# Patient Record
Sex: Male | Born: 1965 | Race: White | Hispanic: No | Marital: Married | Smoking: Never smoker
Health system: Southern US, Community
[De-identification: ages and names within clinical notes are randomized; demographics above are authoritative.]

## PROBLEM LIST (undated history)

## (undated) DIAGNOSIS — M5137 Other intervertebral disc degeneration, lumbosacral region: Secondary | ICD-10-CM

## (undated) DIAGNOSIS — I10 Essential (primary) hypertension: Secondary | ICD-10-CM

## (undated) DIAGNOSIS — T7840XA Allergy, unspecified, initial encounter: Secondary | ICD-10-CM

## (undated) DIAGNOSIS — K219 Gastro-esophageal reflux disease without esophagitis: Secondary | ICD-10-CM

## (undated) DIAGNOSIS — M51379 Other intervertebral disc degeneration, lumbosacral region without mention of lumbar back pain or lower extremity pain: Secondary | ICD-10-CM

## (undated) DIAGNOSIS — C801 Malignant (primary) neoplasm, unspecified: Secondary | ICD-10-CM

## (undated) DIAGNOSIS — M1990 Unspecified osteoarthritis, unspecified site: Secondary | ICD-10-CM

## (undated) HISTORY — DX: Essential (primary) hypertension: I10

## (undated) HISTORY — DX: Unspecified osteoarthritis, unspecified site: M19.90

## (undated) HISTORY — DX: Malignant (primary) neoplasm, unspecified: C80.1

## (undated) HISTORY — DX: Gastro-esophageal reflux disease without esophagitis: K21.9

## (undated) HISTORY — DX: Other intervertebral disc degeneration, lumbosacral region: M51.37

## (undated) HISTORY — PX: OTHER SURGICAL HISTORY: SHX169

## (undated) HISTORY — PX: EXCISION MORTON'S NEUROMA: SHX5013

## (undated) HISTORY — DX: Allergy, unspecified, initial encounter: T78.40XA

## (undated) HISTORY — DX: Other intervertebral disc degeneration, lumbosacral region without mention of lumbar back pain or lower extremity pain: M51.379

## (undated) HISTORY — PX: UPPER GASTROINTESTINAL ENDOSCOPY: SHX188

---

## 1999-08-12 DIAGNOSIS — C801 Malignant (primary) neoplasm, unspecified: Secondary | ICD-10-CM

## 1999-08-12 HISTORY — DX: Malignant (primary) neoplasm, unspecified: C80.1

## 2000-04-10 ENCOUNTER — Other Ambulatory Visit: Admission: RE | Admit: 2000-04-10 | Discharge: 2000-04-10 | Payer: Self-pay | Admitting: Orthopedic Surgery

## 2000-05-21 ENCOUNTER — Encounter: Admission: RE | Admit: 2000-05-21 | Discharge: 2000-08-19 | Payer: Self-pay | Admitting: Radiation Oncology

## 2001-12-10 ENCOUNTER — Ambulatory Visit (HOSPITAL_COMMUNITY): Admission: RE | Admit: 2001-12-10 | Discharge: 2001-12-10 | Payer: Self-pay | Admitting: Gastroenterology

## 2003-05-12 ENCOUNTER — Encounter: Payer: Self-pay | Admitting: Family Medicine

## 2003-05-12 ENCOUNTER — Ambulatory Visit (HOSPITAL_COMMUNITY): Admission: RE | Admit: 2003-05-12 | Discharge: 2003-05-12 | Payer: Self-pay | Admitting: Family Medicine

## 2006-05-26 ENCOUNTER — Ambulatory Visit: Payer: Self-pay | Admitting: Sports Medicine

## 2006-06-09 ENCOUNTER — Ambulatory Visit: Payer: Self-pay | Admitting: Family Medicine

## 2008-09-28 ENCOUNTER — Ambulatory Visit: Payer: Self-pay | Admitting: Sports Medicine

## 2008-09-28 DIAGNOSIS — M7750 Other enthesopathy of unspecified foot: Secondary | ICD-10-CM | POA: Insufficient documentation

## 2008-09-28 DIAGNOSIS — M25559 Pain in unspecified hip: Secondary | ICD-10-CM | POA: Insufficient documentation

## 2008-09-28 DIAGNOSIS — M216X9 Other acquired deformities of unspecified foot: Secondary | ICD-10-CM

## 2008-10-09 ENCOUNTER — Encounter (INDEPENDENT_AMBULATORY_CARE_PROVIDER_SITE_OTHER): Payer: Self-pay | Admitting: *Deleted

## 2008-12-14 ENCOUNTER — Ambulatory Visit: Payer: Self-pay | Admitting: Sports Medicine

## 2009-05-21 ENCOUNTER — Ambulatory Visit: Payer: Self-pay | Admitting: Family Medicine

## 2009-05-21 LAB — CONVERTED CEMR LAB
ALT: 21 units/L (ref 0–53)
AST: 22 units/L (ref 0–37)
Albumin: 4.2 g/dL (ref 3.5–5.2)
Alkaline Phosphatase: 50 units/L (ref 39–117)
BUN: 18 mg/dL (ref 6–23)
Basophils Absolute: 0 10*3/uL (ref 0.0–0.1)
Basophils Relative: 0.4 % (ref 0.0–3.0)
Bilirubin Urine: NEGATIVE
Bilirubin, Direct: 0.1 mg/dL (ref 0.0–0.3)
Blood in Urine, dipstick: NEGATIVE
CO2: 31 meq/L (ref 19–32)
Calcium: 9.1 mg/dL (ref 8.4–10.5)
Chloride: 102 meq/L (ref 96–112)
Cholesterol: 173 mg/dL (ref 0–200)
Creatinine, Ser: 1.1 mg/dL (ref 0.4–1.5)
Eosinophils Absolute: 0.1 10*3/uL (ref 0.0–0.7)
Eosinophils Relative: 2.2 % (ref 0.0–5.0)
GFR calc non Af Amer: 77.89 mL/min (ref >60)
Glucose, Bld: 97 mg/dL (ref 70–99)
Glucose, Urine, Semiquant: NEGATIVE
HCT: 44.3 % (ref 39.0–52.0)
HDL: 47.3 mg/dL (ref >39.00)
Hemoglobin: 15.2 g/dL (ref 13.0–17.0)
Ketones, urine, test strip: NEGATIVE
LDL Cholesterol: 113 mg/dL — ABNORMAL HIGH (ref 0–99)
Lymphocytes Relative: 35.6 % (ref 12.0–46.0)
Lymphs Abs: 1.6 10*3/uL (ref 0.7–4.0)
MCHC: 34.2 g/dL (ref 30.0–36.0)
MCV: 89.6 fL (ref 78.0–100.0)
Monocytes Absolute: 0.3 10*3/uL (ref 0.1–1.0)
Monocytes Relative: 6.7 % (ref 3.0–12.0)
Neutro Abs: 2.4 10*3/uL (ref 1.4–7.7)
Neutrophils Relative %: 55.1 % (ref 43.0–77.0)
Nitrite: NEGATIVE
Platelets: 183 10*3/uL (ref 150.0–400.0)
Potassium: 4.5 meq/L (ref 3.5–5.1)
Protein, U semiquant: NEGATIVE
RBC: 4.95 M/uL (ref 4.22–5.81)
RDW: 12.3 % (ref 11.5–14.6)
Sodium: 139 meq/L (ref 135–145)
Specific Gravity, Urine: 1.015
TSH: 0.82 microintl units/mL (ref 0.35–5.50)
Total Bilirubin: 1 mg/dL (ref 0.3–1.2)
Total CHOL/HDL Ratio: 4
Total Protein: 6.6 g/dL (ref 6.0–8.3)
Triglycerides: 66 mg/dL (ref 0.0–149.0)
Urobilinogen, UA: 0.2
VLDL: 13.2 mg/dL (ref 0.0–40.0)
WBC Urine, dipstick: NEGATIVE
WBC: 4.4 10*3/uL — ABNORMAL LOW (ref 4.5–10.5)
pH: 7.5

## 2009-05-28 ENCOUNTER — Ambulatory Visit: Payer: Self-pay | Admitting: Family Medicine

## 2010-12-27 NOTE — Procedures (Signed)
[facility]  Patient:    Neil Cox, Neil Cox Visit Number: [id number] MRN: [id number]          Service Type: END Location: ENDO Attending Physician:  Louie Bun Dictated by:   Everardo All Madilyn Fireman, M.D. Proc. Date: 12/10/01 Admit Date:  12/10/2001                             Procedure Report  PROCEDURE:  Esophagogastroduodenoscopy with esophageal dilatation.  INDICATION FOR PROCEDURE:  Solid food dysphagia suggestive of lower esophageal ring or stricture.  DESCRIPTION OF PROCEDURE:  The patient was placed in the left lateral decubitus position and placed on the pulse monitor with continuous low-flow oxygen delivered by nasal cannula.  He was sedated with 50 mg IV Demerol and 6 mg IV Versed.  The Olympus video endoscope was advanced under direct vision into the oropharynx and esophagus.  The esophagus was straight and of normal caliber with the squamocolumnar line at 38 cm.  Despite prolonged inspection, I did not see any evidence of a ring, stricture, or other narrowing of the GE junction and no visible esophagitis.  The stomach was entered, and a small amount of liquid secretions were suctioned from the fundus.  Retroflexed view of the cardia was unremarkable.  The fundus, body, antrum, and pylorus all appeared normal.  The duodenum was entered, and both the bulb and second portion were well-inspected and appeared to be within normal limits.  Savary guidewire was passed through the endoscope channel and the scope withdrawn.  A single 17 mm Savary dilator later was passed over the guidewire with minimal resistance and no blood seen on withdrawal.  The dilator was removed together together with the wire, and the patient returned to the recovery room in stable condition.  He tolerated the procedure well, and there were no immediate complications.  IMPRESSION:  No visible esophageal ring or stricture and basically normal endoscopy, status post empiric  dilatation to 17 mm.  PLAN:  Advance diet and observe response to dilatation. Dictated by:   Everardo All Madilyn Fireman, M.D. Attending Physician:  Louie Bun DD:  12/10/01 TD:  12/11/01 Job: [id number]

## 2013-04-20 ENCOUNTER — Encounter: Payer: Self-pay | Admitting: Family Medicine

## 2013-04-20 ENCOUNTER — Ambulatory Visit (INDEPENDENT_AMBULATORY_CARE_PROVIDER_SITE_OTHER): Payer: 59 | Admitting: Family Medicine

## 2013-04-20 VITALS — BP 124/74 | HR 60 | Temp 97.9°F | Ht 68.0 in | Wt 173.0 lb

## 2013-04-20 DIAGNOSIS — C44622 Squamous cell carcinoma of skin of right upper limb, including shoulder: Secondary | ICD-10-CM

## 2013-04-20 DIAGNOSIS — C44621 Squamous cell carcinoma of skin of unspecified upper limb, including shoulder: Secondary | ICD-10-CM | POA: Insufficient documentation

## 2013-04-20 DIAGNOSIS — Z0000 Encounter for general adult medical examination without abnormal findings: Secondary | ICD-10-CM

## 2013-04-20 LAB — TSH: TSH: 0.5 u[IU]/mL (ref 0.35–5.50)

## 2013-04-20 LAB — CBC WITH DIFFERENTIAL/PLATELET
Basophils Absolute: 0 10*3/uL (ref 0.0–0.1)
Basophils Relative: 0.4 % (ref 0.0–3.0)
Eosinophils Absolute: 0.1 10*3/uL (ref 0.0–0.7)
Eosinophils Relative: 2.2 % (ref 0.0–5.0)
HCT: 45.5 % (ref 39.0–52.0)
Hemoglobin: 15.6 g/dL (ref 13.0–17.0)
Lymphocytes Relative: 39.2 % (ref 12.0–46.0)
Lymphs Abs: 2 10*3/uL (ref 0.7–4.0)
MCHC: 34.3 g/dL (ref 30.0–36.0)
MCV: 87.7 fl (ref 78.0–100.0)
Monocytes Absolute: 0.4 10*3/uL (ref 0.1–1.0)
Monocytes Relative: 7.5 % (ref 3.0–12.0)
Neutro Abs: 2.5 10*3/uL (ref 1.4–7.7)
Neutrophils Relative %: 50.7 % (ref 43.0–77.0)
Platelets: 198 10*3/uL (ref 150.0–400.0)
RBC: 5.19 Mil/uL (ref 4.22–5.81)
RDW: 13.2 % (ref 11.5–14.6)
WBC: 5 10*3/uL (ref 4.5–10.5)

## 2013-04-20 LAB — LIPID PANEL
Cholesterol: 174 mg/dL (ref 0–200)
HDL: 61.8 mg/dL (ref >39.00)
LDL Cholesterol: 103 mg/dL — ABNORMAL HIGH (ref 0–99)
Total CHOL/HDL Ratio: 3
Triglycerides: 44 mg/dL (ref 0.0–149.0)
VLDL: 8.8 mg/dL (ref 0.0–40.0)

## 2013-04-20 LAB — HEPATIC FUNCTION PANEL
ALT: 20 U/L (ref 0–53)
AST: 23 U/L (ref 0–37)
Albumin: 4.3 g/dL (ref 3.5–5.2)
Alkaline Phosphatase: 42 U/L (ref 39–117)
Bilirubin, Direct: 0.2 mg/dL (ref 0.0–0.3)
Total Bilirubin: 1.1 mg/dL (ref 0.3–1.2)
Total Protein: 7.1 g/dL (ref 6.0–8.3)

## 2013-04-20 LAB — BASIC METABOLIC PANEL
BUN: 15 mg/dL (ref 6–23)
CO2: 29 mEq/L (ref 19–32)
Calcium: 9.1 mg/dL (ref 8.4–10.5)
Chloride: 100 mEq/L (ref 96–112)
Creatinine, Ser: 1 mg/dL (ref 0.4–1.5)
GFR: 90.61 mL/min (ref >60.00)
Glucose, Bld: 98 mg/dL (ref 70–99)
Potassium: 4.7 mEq/L (ref 3.5–5.1)
Sodium: 135 mEq/L (ref 135–145)

## 2013-04-20 NOTE — Progress Notes (Signed)
  Subjective:    Patient ID: Neil Cox, male    DOB: 08-Feb-1966, 47 y.o.   MRN: [id number]  HPI  Patient seen to reestablish care and for complete physical Generally very healthy. He had squamous cell carcinoma right thumb back in 2001. He is followed annually by dermatology for skin screening. He relates past history of mildly elevated blood pressure but never treated. He has had some osteoarthritis involving the lumbar spine. He exercises regularly.  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 He does regular core strengthening exercises. No history of smoking. Only rare alcohol use. Patient is married has 2 daughters.  Past Medical History  Diagnosis Date  . Arthritis   . Cancer   . Allergy   . Hypertension    History reviewed. No pertinent past surgical history.  reports that he has never smoked. He does not have any smokeless tobacco history on file. He reports that  drinks alcohol. He reports that he does not use illicit drugs. family history includes Arthritis in his father and paternal grandmother; Hypertension in his paternal grandmother. No Known Allergies   Review of Systems  Constitutional: Negative for fever, activity change, appetite change and fatigue.  HENT: Negative for ear pain, congestion and trouble swallowing.   Eyes: Negative for pain and visual disturbance.  Respiratory: Negative for cough, shortness of breath and wheezing.   Cardiovascular: Negative for chest pain and palpitations.  Gastrointestinal: Negative for nausea, vomiting, abdominal pain, diarrhea, constipation, blood in stool, abdominal distention and rectal pain.  Genitourinary: Negative for dysuria, hematuria and testicular pain.  Musculoskeletal: Positive for back pain. Negative for joint swelling and arthralgias.  Skin: Negative for rash.  Neurological: Negative for dizziness, syncope and headaches.  Hematological: Negative for adenopathy.  Psychiatric/Behavioral: Negative for confusion  and dysphoric mood.       Objective:   Physical Exam  Constitutional: He is oriented to person, place, and time. He appears well-developed and well-nourished. No distress.  HENT:  Head: Normocephalic and atraumatic.  Right Ear: External ear normal.  Left Ear: External ear normal.  Mouth/Throat: Oropharynx is clear and moist.  Eyes: Conjunctivae and EOM are normal. Pupils are equal, round, and reactive to light.  Neck: Normal range of motion. Neck supple. No thyromegaly present.  Cardiovascular: Normal rate, regular rhythm and normal heart sounds.   No murmur heard. Pulmonary/Chest: No respiratory distress. He has no wheezes. He has no rales.  Abdominal: Soft. Bowel sounds are normal. He exhibits no distension and no mass. There is no tenderness. There is no rebound and no guarding.  Genitourinary: Rectum normal and prostate normal.  Musculoskeletal: He exhibits no edema.  Lymphadenopathy:    He has no cervical adenopathy.  Neurological: He is alert and oriented to person, place, and time. He displays normal reflexes. No cranial nerve deficit.  Skin: No rash noted.  Psychiatric: He has a normal mood and affect.          Assessment & Plan:  Complete physical. Obtain screening lab work. Patient plans to get flu vaccine later through work. Continue yearly skin screening with prior history of squamous cell skin cancer

## 2013-05-23 ENCOUNTER — Ambulatory Visit: Payer: 59

## 2013-06-07 ENCOUNTER — Ambulatory Visit: Payer: Self-pay

## 2013-11-15 ENCOUNTER — Encounter: Payer: Self-pay | Admitting: Family Medicine

## 2013-11-15 ENCOUNTER — Ambulatory Visit (INDEPENDENT_AMBULATORY_CARE_PROVIDER_SITE_OTHER): Payer: 59 | Admitting: Family Medicine

## 2013-11-15 VITALS — BP 134/80 | HR 71 | Temp 98.6°F | Ht 68.25 in | Wt 172.8 lb

## 2013-11-15 DIAGNOSIS — M25559 Pain in unspecified hip: Secondary | ICD-10-CM

## 2013-11-15 DIAGNOSIS — M25552 Pain in left hip: Secondary | ICD-10-CM

## 2013-11-15 MED ORDER — CYCLOBENZAPRINE HCL 10 MG PO TABS: 10.0000 mg | ORAL_TABLET | Freq: Three times a day (TID) | ORAL | Status: DC | PRN

## 2013-11-15 MED ORDER — NAPROXEN 500 MG PO TABS: 500.0000 mg | ORAL_TABLET | Freq: Two times a day (BID) | ORAL | Status: DC

## 2013-11-15 NOTE — Progress Notes (Signed)
   Subjective:    Patient ID: Neil Cox, male    DOB: [birth year], 48 y.o.   MRN: [id number]  HPI  This 48 y.o. male presents for evaluation of left hip discomfort.  Review of Systems No chest pain, SOB, HA, dizziness, vision change, N/V, diarrhea, constipation, dysuria, urinary urgency or frequency, myalgias, arthralgias or rash.     Objective:   Physical Exam Vital signs noted  Well developed well nourished male.  HEENT - Head atraumatic Normocephalic Respiratory - Lungs CTA bilateral Cardiac - RRR S1 and S2 without murmur GI - Abdomen soft Nontender and bowel sounds active x 4 MS - Left Piriforms muscles TTP       Assessment & Plan:  Left hip pain - Plan: naproxen (NAPROSYN) 500 MG tablet, cyclobenzaprine (FLEXERIL) 10 MG tablet  Lysbeth Penner FNP

## 2014-01-31 ENCOUNTER — Encounter: Payer: Self-pay | Admitting: Family Medicine

## 2014-01-31 ENCOUNTER — Ambulatory Visit (INDEPENDENT_AMBULATORY_CARE_PROVIDER_SITE_OTHER): Payer: 59 | Admitting: Family Medicine

## 2014-01-31 VITALS — BP 143/78 | HR 81 | Ht 68.0 in | Wt 170.0 lb

## 2014-01-31 DIAGNOSIS — M722 Plantar fascial fibromatosis: Secondary | ICD-10-CM

## 2014-01-31 DIAGNOSIS — R269 Unspecified abnormalities of gait and mobility: Secondary | ICD-10-CM

## 2014-02-01 ENCOUNTER — Encounter: Payer: Self-pay | Admitting: Family Medicine

## 2014-02-01 DIAGNOSIS — R269 Unspecified abnormalities of gait and mobility: Secondary | ICD-10-CM | POA: Insufficient documentation

## 2014-02-01 DIAGNOSIS — M722 Plantar fascial fibromatosis: Secondary | ICD-10-CM | POA: Insufficient documentation

## 2014-02-01 NOTE — Progress Notes (Signed)
Patient ID: Neil Cox, male   DOB: [birth year], 48 y.o.   MRN: [id number]  PCP: Anthoney Harada, MD  Subjective:   HPI: Patient is a 48 y.o. male here for orthotics.  Patient has prior history of bilateral plantar fasciitis that has been flaring up past year (dates back to over 10 years ago). Has tried PT, home exercises, stretches, medications, remotely had injeciton - orthotics were only thing to help and current ones are several years old, broken down. Gets pain at base of left 5th metatarsal and at 1st MTPs at times. Current orthotics are cracked and MT pads worn down.  Past Medical History  Diagnosis Date  . Arthritis   . Cancer   . Allergy   . Hypertension     Current Outpatient Prescriptions on File Prior to Visit  Medication Sig Dispense Refill  . cyclobenzaprine (FLEXERIL) 10 MG tablet Take 1 tablet (10 mg total) by mouth 3 (three) times daily as needed for muscle spasms.  30 tablet  1  . naproxen (NAPROSYN) 500 MG tablet Take 1 tablet (500 mg total) by mouth 2 (two) times daily with a meal.  30 tablet  0   No current facility-administered medications on file prior to visit.    History reviewed. No pertinent past surgical history.  No Known Allergies  History   Social History  . Marital Status: Married    Spouse Name: N/A    Number of Children: N/A  . Years of Education: N/A   Occupational History  . Not on file.   Social History Main Topics  . Smoking status: Never Smoker   . Smokeless tobacco: Not on file  . Alcohol Use: Yes  . Drug Use: No  . Sexual Activity: Not on file   Other Topics Concern  . Not on file   Social History Narrative  . No narrative on file    Family History  Problem Relation Age of Onset  . Arthritis Father   . Arthritis Paternal Grandmother   . Hypertension Paternal Grandmother     BP 143/78  Pulse 81  Ht 5\' 8"  (1.727 m)  Wt 170 lb (77.111 kg)  BMI 25.85 kg/m2  Review of Systems: See HPI above.     Objective:  Physical Exam:  Gen: NAD  Bilateral feet: Leg lengths equal. Bilateral transverse arch collapse, mild overpronation.  Minimal callus left side over 3rd MT head. No other gross deformity, swelling, ecchymoses FROM ankles, 1st MTPs. Minimal TTP bilateral planar fasciae. Negative ant drawer and talar tilt.   Thompsons test negative. NV intact distally.    Assessment & Plan:  1. Bilateral plantar fasciitis - with transverse arch breakdown as well.  New orthotics made today. Patient was fitted for a : standard, cushioned, semi-rigid orthotic. The orthotic was heated and afterward the patient stood on the orthotic blank positioned on the orthotic stand. The patient was positioned in subtalar neutral position and 10 degrees of ankle dorsiflexion in a weight bearing stance. After completion of molding, a stable base was applied to the orthotic blank. The blank was ground to a stable position for weight bearing. Size: 10 Base: blue med density eva Posting: none Additional orthotic padding: MT pads Total prep time 45 minutes

## 2014-02-01 NOTE — Assessment & Plan Note (Signed)
with transverse arch breakdown as well.  New orthotics made today. Patient was fitted for a : standard, cushioned, semi-rigid orthotic. The orthotic was heated and afterward the patient stood on the orthotic blank positioned on the orthotic stand. The patient was positioned in subtalar neutral position and 10 degrees of ankle dorsiflexion in a weight bearing stance. After completion of molding, a stable base was applied to the orthotic blank. The blank was ground to a stable position for weight bearing. Size: 10 Base: blue med density eva Posting: none Additional orthotic padding: MT pads

## 2014-02-15 ENCOUNTER — Encounter: Payer: 59 | Admitting: Sports Medicine

## 2014-02-22 ENCOUNTER — Encounter: Payer: 59 | Admitting: Sports Medicine

## 2014-04-12 ENCOUNTER — Ambulatory Visit (INDEPENDENT_AMBULATORY_CARE_PROVIDER_SITE_OTHER): Payer: 59 | Admitting: Sports Medicine

## 2014-04-12 ENCOUNTER — Ambulatory Visit: Payer: 59 | Admitting: Sports Medicine

## 2014-04-12 ENCOUNTER — Encounter: Payer: Self-pay | Admitting: Sports Medicine

## 2014-04-12 VITALS — BP 119/78 | HR 67 | Ht 68.0 in | Wt 168.0 lb

## 2014-04-12 DIAGNOSIS — M25559 Pain in unspecified hip: Secondary | ICD-10-CM

## 2014-04-12 DIAGNOSIS — M722 Plantar fascial fibromatosis: Secondary | ICD-10-CM

## 2014-04-12 DIAGNOSIS — M25551 Pain in right hip: Secondary | ICD-10-CM

## 2014-04-12 DIAGNOSIS — R269 Unspecified abnormalities of gait and mobility: Secondary | ICD-10-CM

## 2014-04-12 NOTE — Progress Notes (Signed)
Patient ID: Neil Cox, male   DOB: 1966-02-04, 48 y.o.   MRN: [id number]   Patient had orthotics made in July by Dr Barbaraann Barthel Long standing foot pain from PF and MT Supinated gait and cavus foot  His orthotics had been used 5 years and had broken down about 3 mos before replaced Almost all of his foot pain came back over PF and MT areas Now 80% of this has resolved with new orthotics  However, left lateral foot is painful and fells pressure with running, walking  Second problem is RT lateral hip pain Had seen Dr Veverly Fells for meniscus in RT knee Developed greater trochanteric bursal area pain CSI helped briefly Piriformis stretch recreates pain laterally  Exam NAD BP 119/78  Pulse 67  Ht 5\' 8"  (1.727 m)  Wt 168 lb (76.204 kg)  BMI 25.55 kg/m2  Foot is cavus shape but with standing has some loss of long arch No TTP over MT or PF today Pain along lateral column - 4th/5th rays on left with more rotation of toes 4 and 5 Walking gait is supinated - more on left  Hip: ROM normal bilat FABER is tight / RT > LT Strength Abduction weak on RT only IR: 5/5, ER: 5/5, Flexion: 5/5, Extension: 5/5, , Adduction: 5/5 Pelvic alignment unremarkable to inspection and palpation. Standing hip rotation and gait without trendelenburg / unsteadiness. Greater trochanter sometenderness to palpation.  No SI joint tenderness and normal minimal SI movement.

## 2014-04-12 NOTE — Assessment & Plan Note (Signed)
sxs improved post orthotics

## 2014-04-12 NOTE — Assessment & Plan Note (Signed)
Corrects well with orthotics We added lateral column post on left orthotic  This was comfortable after correction  See if this resolves pain

## 2014-04-12 NOTE — Assessment & Plan Note (Signed)
Hx of this in past  Now with weakness  Will start HEP daily to focus on abduction strength  Reck and scan if not resolved in 6 wks

## 2014-05-03 ENCOUNTER — Other Ambulatory Visit (INDEPENDENT_AMBULATORY_CARE_PROVIDER_SITE_OTHER): Payer: 59

## 2014-05-03 DIAGNOSIS — Z0000 Encounter for general adult medical examination without abnormal findings: Secondary | ICD-10-CM

## 2014-05-03 LAB — CBC WITH DIFFERENTIAL/PLATELET
BASOS ABS: 0 10*3/uL (ref 0.0–0.1)
Basophils Relative: 0.4 % (ref 0.0–3.0)
EOS PCT: 0.9 % (ref 0.0–5.0)
Eosinophils Absolute: 0 10*3/uL (ref 0.0–0.7)
HCT: 42.9 % (ref 39.0–52.0)
Hemoglobin: 14.3 g/dL (ref 13.0–17.0)
Lymphocytes Relative: 36.6 % (ref 12.0–46.0)
Lymphs Abs: 1.7 10*3/uL (ref 0.7–4.0)
MCHC: 33.4 g/dL (ref 30.0–36.0)
MCV: 89.3 fl (ref 78.0–100.0)
MONO ABS: 0.3 10*3/uL (ref 0.1–1.0)
Monocytes Relative: 7.4 % (ref 3.0–12.0)
NEUTROS PCT: 54.7 % (ref 43.0–77.0)
Neutro Abs: 2.6 10*3/uL (ref 1.4–7.7)
PLATELETS: 173 10*3/uL (ref 150.0–400.0)
RBC: 4.81 Mil/uL (ref 4.22–5.81)
RDW: 13.2 % (ref 11.5–15.5)
WBC: 4.7 10*3/uL (ref 4.0–10.5)

## 2014-05-03 LAB — BASIC METABOLIC PANEL
BUN: 14 mg/dL (ref 6–23)
CO2: 28 mEq/L (ref 19–32)
Calcium: 8.9 mg/dL (ref 8.4–10.5)
Chloride: 101 mEq/L (ref 96–112)
Creatinine, Ser: 1 mg/dL (ref 0.4–1.5)
GFR: 85.02 mL/min (ref >60.00)
Glucose, Bld: 96 mg/dL (ref 70–99)
Potassium: 3.6 mEq/L (ref 3.5–5.1)
Sodium: 135 mEq/L (ref 135–145)

## 2014-05-03 LAB — POCT URINALYSIS DIPSTICK
BILIRUBIN UA: NEGATIVE
Blood, UA: NEGATIVE
Glucose, UA: NEGATIVE
KETONES UA: NEGATIVE
LEUKOCYTES UA: NEGATIVE
Nitrite, UA: NEGATIVE
Protein, UA: NEGATIVE
Spec Grav, UA: 1.01
Urobilinogen, UA: 0.2
pH, UA: 7

## 2014-05-03 LAB — HEPATIC FUNCTION PANEL
ALK PHOS: 38 U/L — AB (ref 39–117)
ALT: 18 U/L (ref 0–53)
AST: 18 U/L (ref 0–37)
Albumin: 4.3 g/dL (ref 3.5–5.2)
BILIRUBIN DIRECT: 0.1 mg/dL (ref 0.0–0.3)
BILIRUBIN TOTAL: 1.1 mg/dL (ref 0.2–1.2)
Total Protein: 7 g/dL (ref 6.0–8.3)

## 2014-05-03 LAB — LIPID PANEL
CHOLESTEROL: 158 mg/dL (ref 0–200)
HDL: 50.7 mg/dL (ref >39.00)
LDL CALC: 97 mg/dL (ref 0–99)
NonHDL: 107.3
TRIGLYCERIDES: 54 mg/dL (ref 0.0–149.0)
Total CHOL/HDL Ratio: 3
VLDL: 10.8 mg/dL (ref 0.0–40.0)

## 2014-05-03 LAB — TSH: TSH: 0.37 u[IU]/mL (ref 0.35–4.50)

## 2014-05-08 ENCOUNTER — Ambulatory Visit (INDEPENDENT_AMBULATORY_CARE_PROVIDER_SITE_OTHER): Payer: 59 | Admitting: Family Medicine

## 2014-05-08 ENCOUNTER — Encounter: Payer: Self-pay | Admitting: Family Medicine

## 2014-05-08 VITALS — BP 128/74 | HR 72 | Temp 98.3°F | Ht 68.0 in | Wt 170.0 lb

## 2014-05-08 DIAGNOSIS — Z23 Encounter for immunization: Secondary | ICD-10-CM

## 2014-05-08 DIAGNOSIS — Z0000 Encounter for general adult medical examination without abnormal findings: Secondary | ICD-10-CM

## 2014-05-08 NOTE — Progress Notes (Signed)
Pre visit review using our clinic review tool, if applicable. No additional management support is needed unless otherwise documented below in the visit note. 

## 2014-05-08 NOTE — Addendum Note (Signed)
Addended by: Marcina Millard on: 05/08/2014 10:48 AM   Modules accepted: Orders

## 2014-05-08 NOTE — Progress Notes (Signed)
   Subjective:    Patient ID: Neil Cox, male    DOB: Sep 23, 1965, 48 y.o.   MRN: [id number]  HPI Patient here for well visit. He has a very health conscious. He and his wife follow low saturated fat diet. Exercises regularly. Never smoked. Last tetanus 10 years ago. Plans to get flu vaccine through work. He has remote history of squamous cell carcinoma of the thumb and is followed regularly by dermatology.  Past Medical History  Diagnosis Date  . Arthritis   . Cancer   . Allergy   . Hypertension    No past surgical history on file.  reports that he has never smoked. He does not have any smokeless tobacco history on file. He reports that he drinks alcohol. He reports that he does not use illicit drugs. family history includes Arthritis in his father and paternal grandmother; Diabetes in his brother and father; Hypertension in his paternal grandmother. No Known Allergies    Review of Systems  Constitutional: Negative for fever, activity change, appetite change and fatigue.  HENT: Negative for congestion, ear pain and trouble swallowing.   Eyes: Negative for pain and visual disturbance.  Respiratory: Negative for cough, shortness of breath and wheezing.   Cardiovascular: Negative for chest pain and palpitations.  Gastrointestinal: Negative for nausea, vomiting, abdominal pain, diarrhea, constipation, blood in stool, abdominal distention and rectal pain.  Endocrine: Negative for polydipsia and polyuria.  Genitourinary: Negative for dysuria, hematuria and testicular pain.  Musculoskeletal: Negative for arthralgias and joint swelling.  Skin: Negative for rash.  Neurological: Negative for dizziness, syncope and headaches.  Hematological: Negative for adenopathy.  Psychiatric/Behavioral: Negative for confusion and dysphoric mood.       Objective:   Physical Exam  Constitutional: He is oriented to person, place, and time. He appears well-developed and well-nourished. No distress.    HENT:  Head: Normocephalic and atraumatic.  Right Ear: External ear normal.  Left Ear: External ear normal.  Mouth/Throat: Oropharynx is clear and moist.  Eyes: Conjunctivae and EOM are normal. Pupils are equal, round, and reactive to light.  Neck: Normal range of motion. Neck supple. No thyromegaly present.  Cardiovascular: Normal rate, regular rhythm and normal heart sounds.   No murmur heard. Pulmonary/Chest: No respiratory distress. He has no wheezes. He has no rales.  Abdominal: Soft. Bowel sounds are normal. He exhibits no distension and no mass. There is no tenderness. There is no rebound and no guarding.  Musculoskeletal: He exhibits no edema.  Lymphadenopathy:    He has no cervical adenopathy.  Neurological: He is alert and oriented to person, place, and time. He displays normal reflexes. No cranial nerve deficit.  Skin: No rash noted.  Psychiatric: He has a normal mood and affect.          Assessment & Plan:  Patient is seen for complete physical. Tetanus booster given. Labs reviewed with no major concerns. He'll continue regular dermatology followup for skin cancer screening. Flu vaccine through work

## 2014-05-10 ENCOUNTER — Encounter: Payer: 59 | Admitting: Family Medicine

## 2015-03-06 ENCOUNTER — Emergency Department (HOSPITAL_COMMUNITY)
Admission: EM | Admit: 2015-03-06 | Discharge: 2015-03-06 | Disposition: A | Discharge status: Home / Self Care | Payer: 59 | Attending: Emergency Medicine | Admitting: Emergency Medicine

## 2015-03-06 ENCOUNTER — Encounter (HOSPITAL_COMMUNITY): Payer: Self-pay | Admitting: *Deleted

## 2015-03-06 DIAGNOSIS — Z8739 Personal history of other diseases of the musculoskeletal system and connective tissue: Secondary | ICD-10-CM | POA: Insufficient documentation

## 2015-03-06 DIAGNOSIS — Z23 Encounter for immunization: Secondary | ICD-10-CM | POA: Diagnosis present

## 2015-03-06 DIAGNOSIS — I10 Essential (primary) hypertension: Secondary | ICD-10-CM | POA: Diagnosis not present

## 2015-03-06 DIAGNOSIS — Z859 Personal history of malignant neoplasm, unspecified: Secondary | ICD-10-CM | POA: Diagnosis not present

## 2015-03-06 DIAGNOSIS — Z203 Contact with and (suspected) exposure to rabies: Secondary | ICD-10-CM | POA: Diagnosis not present

## 2015-03-06 DIAGNOSIS — Z79899 Other long term (current) drug therapy: Secondary | ICD-10-CM | POA: Insufficient documentation

## 2015-03-06 MED ORDER — RABIES IMMUNE GLOBULIN 150 UNIT/ML IM INJ
20.0000 [IU]/kg | INJECTION | Freq: Once | INTRAMUSCULAR | Status: AC
  Administered 2015-03-06: 1575 [IU] via INTRAMUSCULAR
  Filled 2015-03-06: qty 10.5

## 2015-03-06 MED ORDER — RABIES VACCINE, PCEC IM SUSR
1.0000 mL | Freq: Once | INTRAMUSCULAR | Status: AC
  Administered 2015-03-06: 1 mL via INTRAMUSCULAR
  Filled 2015-03-06: qty 1

## 2015-03-06 NOTE — Discharge Instructions (Signed)
°  Rabies Vaccine      [facility]     [address], [place] [address]              [phone]                                  [place] PATIENT  Patient's Name: Retia Passe Laba                     Original Order Date:  03/06/2015  Medical Record Number: [id number]  ED Physician: . Primary Diagnosis: Rabies Exposure       PCP: Eulas Post, MD . RABIES VACCINE:  Patient Phone Number: (home) [phone] (home)    (cell)  Telephone Information:  Mobile [phone]    (work) [phone] (work) Species of Animal: bat IMMUNOGLOBULIN INJECTION GIVEN IN THE ER?: yes   You have been seen in the Emergency Department for a possible rabies exposure.  You must return for the additional vaccine doses to the Urgent [place] ([place] next to [facility].  If needed, your immunoglobulin follow-up injections, need to be scheduled for the dates below. If your first visit should fall on a weekend day, please come anytime between the hours of 9am-7pm.  DAY 0:  Date 03/06/15     To: ER  DAY 3:  Date 03/09/15     To:  Urgent Care  DAY 7:  Date 03/13/15     To:  Urgent Care  DAY 14:  Date 03/20/15   To:  Urgent Care  The 5th vaccine injection is considered for immune compromised patients only.  The Urgent Care Center is open from 8am-8pm Monday thru Friday and 9am-7pm on Saturdays and Sundays.  There will be a minimal fee for the injection that will be billed to your insurance company along with the charge for the vaccine.                                                                                                Date: 03/06/2015  Patient Signature: _________________________________________________  [facility] Copy   Patient Copy      Pharmacy Copy

## 2015-03-06 NOTE — ED Provider Notes (Signed)
CSN: [id number]     Arrival date & time 03/06/15  1505 History  This chart was scribed for non-physician practitioner, Domenic Moras, PA-C working with Sharlett Iles, MD by Tula Nakayama, ED scribe. This patient was seen in room TR11C/TR11C and the patient's care was started at 3:57 PM  Chief Complaint  Patient presents with  . Rabies Injection   The history is provided by the patient. No language interpreter was used.    HPI Comments: Neil Cox is a 49 y.o. male who presents to the Emergency Department for a rabies vaccination. Pt reports that he found a bat in his house last night and, upon further evaluation, found multiple bat colonies in his attic. He and his family wish to get a series of rabies vaccinations as a precaution. Pt denies a history of HIV or any immunocompromising factors. He also denies abdominal pain, chest pain, rash, bite marks or pain as associated symptoms.  Past Medical History  Diagnosis Date  . Arthritis   . Cancer   . Allergy   . Hypertension    History reviewed. No pertinent past surgical history. Family History  Problem Relation Age of Onset  . Arthritis Father   . Diabetes Father   . Arthritis Paternal Grandmother   . Hypertension Paternal Grandmother   . Diabetes Brother    History  Substance Use Topics  . Smoking status: Never Smoker   . Smokeless tobacco: Not on file  . Alcohol Use: Yes     Comment: 6 pack per week    Review of Systems  Cardiovascular: Negative for chest pain.  Gastrointestinal: Negative for abdominal pain.  Skin: Negative for rash and wound.    Allergies  Review of patient's allergies indicates no known allergies.  Home Medications   Prior to Admission medications   Medication Sig Start Date End Date Taking? Authorizing Provider  cyclobenzaprine (FLEXERIL) 10 MG tablet Take 1 tablet (10 mg total) by mouth 3 (three) times daily as needed for muscle spasms. 11/15/13   Lysbeth Penner, FNP  naproxen  (NAPROSYN) 500 MG tablet Take 1 tablet (500 mg total) by mouth 2 (two) times daily with a meal. 11/15/13   Lysbeth Penner, FNP   BP 129/78 mmHg  Pulse 72  Temp(Src) 97.9 F (36.6 C) (Oral)  Resp 14  SpO2 96% Physical Exam  Constitutional: He appears well-developed and well-nourished. No distress.  HENT:  Head: Normocephalic and atraumatic.  Eyes: Conjunctivae and EOM are normal.  Neck: Neck supple. No tracheal deviation present.  Cardiovascular: Normal rate, regular rhythm and normal heart sounds.   Pulmonary/Chest: Effort normal and breath sounds normal. No respiratory distress.  Skin: Skin is warm and dry.  Psychiatric: He has a normal mood and affect. His behavior is normal.  Nursing note and vitals reviewed.   ED Course  Procedures   DIAGNOSTIC STUDIES: Oxygen Saturation is 96% on RA, normal by my interpretation.    COORDINATION OF CARE: 4:01 PM Will administer first Rabies vaccination. Discussed treatment plan and vaccination series with pt at bedside. He agreed to plan.  4:35 PM Pt received rabies prophylactic schedule and will f/u appropriately.   Labs Review Labs Reviewed - No data to display  Imaging Review No results found.   EKG Interpretation None      MDM   Final diagnoses:  Rabies exposure    BP 129/78 mmHg  Pulse 72  Temp(Src) 97.9 F (36.6 C) (Oral)  Resp 14  Ht 5'  8" (1.727 m)  Wt 174 lb 4.8 oz (79.062 kg)  BMI 26.51 kg/m2  SpO2 96%   I personally performed the services described in this documentation, which was scribed in my presence. The recorded information has been reviewed and is accurate.     Domenic Moras, PA-C 03/06/15 [place], MD 03/08/15 [phone]

## 2015-03-06 NOTE — ED Notes (Signed)
Pt sates that he found a bat in his daughters room yesterday. States that he has not had rabies vaccines before. Not aware of being bit.

## 2015-03-09 ENCOUNTER — Encounter (HOSPITAL_COMMUNITY): Payer: Self-pay

## 2015-03-09 ENCOUNTER — Emergency Department (HOSPITAL_COMMUNITY)
Admission: EM | Admit: 2015-03-09 | Discharge: 2015-03-09 | Disposition: A | Discharge status: Home / Self Care | Payer: 59 | Source: Home / Self Care

## 2015-03-09 MED ORDER — RABIES VACCINE, PCEC IM SUSR
1.0000 mL | Freq: Once | INTRAMUSCULAR | Status: AC
  Administered 2015-03-09: 1 mL via INTRAMUSCULAR

## 2015-03-09 MED ORDER — RABIES VACCINE, PCEC IM SUSR
INTRAMUSCULAR | Status: AC
  Filled 2015-03-09: qty 1

## 2015-03-09 NOTE — ED Notes (Addendum)
Day #3 of series, no c/o

## 2015-03-13 ENCOUNTER — Emergency Department (INDEPENDENT_AMBULATORY_CARE_PROVIDER_SITE_OTHER)
Admission: EM | Admit: 2015-03-13 | Discharge: 2015-03-13 | Disposition: A | Discharge status: Home / Self Care | Payer: 59 | Source: Home / Self Care

## 2015-03-13 ENCOUNTER — Encounter (HOSPITAL_COMMUNITY): Payer: Self-pay | Admitting: Emergency Medicine

## 2015-03-13 DIAGNOSIS — Z203 Contact with and (suspected) exposure to rabies: Secondary | ICD-10-CM

## 2015-03-13 MED ORDER — RABIES VACCINE, PCEC IM SUSR
INTRAMUSCULAR | Status: AC
  Filled 2015-03-13: qty 1

## 2015-03-13 MED ORDER — RABIES VACCINE, PCEC IM SUSR
1.0000 mL | Freq: Once | INTRAMUSCULAR | Status: AC
  Administered 2015-03-13: 1 mL via INTRAMUSCULAR

## 2015-03-13 NOTE — Discharge Instructions (Signed)
Return on 8/9 for final rabies vaccination  °

## 2015-03-13 NOTE — ED Notes (Signed)
Here for day 7 rabies vaccination (#3) Voices no new concerns Alert, no acute distress.  

## 2015-03-24 ENCOUNTER — Encounter (HOSPITAL_COMMUNITY): Payer: Self-pay | Admitting: Emergency Medicine

## 2015-03-24 ENCOUNTER — Emergency Department (HOSPITAL_COMMUNITY)
Admission: EM | Admit: 2015-03-24 | Discharge: 2015-03-24 | Disposition: A | Discharge status: Home / Self Care | Payer: 59 | Source: Home / Self Care

## 2015-03-24 DIAGNOSIS — Z203 Contact with and (suspected) exposure to rabies: Secondary | ICD-10-CM | POA: Diagnosis not present

## 2015-03-24 MED ORDER — RABIES VACCINE, PCEC IM SUSR
INTRAMUSCULAR | Status: AC
  Filled 2015-03-24: qty 1

## 2015-03-24 MED ORDER — RABIES VACCINE, PCEC IM SUSR
1.0000 mL | Freq: Once | INTRAMUSCULAR | Status: AC
  Administered 2015-03-24: 1 mL via INTRAMUSCULAR

## 2015-03-24 NOTE — Discharge Instructions (Signed)
Return as needed

## 2015-03-24 NOTE — ED Notes (Signed)
Rabies series.  This injection was intended for as day 14, #4 in series.

## 2015-09-12 ENCOUNTER — Other Ambulatory Visit (INDEPENDENT_AMBULATORY_CARE_PROVIDER_SITE_OTHER): Payer: 59

## 2015-09-12 DIAGNOSIS — Z0000 Encounter for general adult medical examination without abnormal findings: Secondary | ICD-10-CM | POA: Diagnosis not present

## 2015-09-12 LAB — LIPID PANEL
Cholesterol: 161 mg/dL (ref 0–200)
HDL: 56.1 mg/dL (ref >39.00)
LDL Cholesterol: 92 mg/dL (ref 0–99)
NonHDL: 105.1
Total CHOL/HDL Ratio: 3
Triglycerides: 67 mg/dL (ref 0.0–149.0)
VLDL: 13.4 mg/dL (ref 0.0–40.0)

## 2015-09-12 LAB — CBC WITH DIFFERENTIAL/PLATELET
BASOS ABS: 0 10*3/uL (ref 0.0–0.1)
Basophils Relative: 0.4 % (ref 0.0–3.0)
EOS ABS: 0.1 10*3/uL (ref 0.0–0.7)
Eosinophils Relative: 1.2 % (ref 0.0–5.0)
HEMATOCRIT: 45.9 % (ref 39.0–52.0)
HEMOGLOBIN: 15.4 g/dL (ref 13.0–17.0)
LYMPHS PCT: 31.8 % (ref 12.0–46.0)
Lymphs Abs: 1.8 10*3/uL (ref 0.7–4.0)
MCHC: 33.5 g/dL (ref 30.0–36.0)
MCV: 88.2 fl (ref 78.0–100.0)
Monocytes Absolute: 0.4 10*3/uL (ref 0.1–1.0)
Monocytes Relative: 7.4 % (ref 3.0–12.0)
Neutro Abs: 3.4 10*3/uL (ref 1.4–7.7)
Neutrophils Relative %: 59.2 % (ref 43.0–77.0)
PLATELETS: 198 10*3/uL (ref 150.0–400.0)
RBC: 5.21 Mil/uL (ref 4.22–5.81)
RDW: 13.6 % (ref 11.5–15.5)
WBC: 5.8 10*3/uL (ref 4.0–10.5)

## 2015-09-12 LAB — HEPATIC FUNCTION PANEL
ALK PHOS: 42 U/L (ref 39–117)
ALT: 14 U/L (ref 0–53)
AST: 17 U/L (ref 0–37)
Albumin: 4.4 g/dL (ref 3.5–5.2)
Bilirubin, Direct: 0.2 mg/dL (ref 0.0–0.3)
TOTAL PROTEIN: 7.1 g/dL (ref 6.0–8.3)
Total Bilirubin: 1 mg/dL (ref 0.2–1.2)

## 2015-09-12 LAB — BASIC METABOLIC PANEL
BUN: 17 mg/dL (ref 6–23)
CALCIUM: 9.5 mg/dL (ref 8.4–10.5)
CO2: 28 mEq/L (ref 19–32)
CREATININE: 0.94 mg/dL (ref 0.40–1.50)
Chloride: 102 mEq/L (ref 96–112)
GFR: 90.79 mL/min (ref >60.00)
Glucose, Bld: 106 mg/dL — ABNORMAL HIGH (ref 70–99)
Potassium: 4.3 mEq/L (ref 3.5–5.1)
Sodium: 138 mEq/L (ref 135–145)

## 2015-09-12 LAB — TSH: TSH: 0.56 u[IU]/mL (ref 0.35–4.50)

## 2015-09-19 ENCOUNTER — Encounter: Payer: Self-pay | Admitting: Family Medicine

## 2015-09-19 ENCOUNTER — Ambulatory Visit (INDEPENDENT_AMBULATORY_CARE_PROVIDER_SITE_OTHER): Payer: 59 | Admitting: Family Medicine

## 2015-09-19 VITALS — BP 138/82 | HR 76 | Temp 98.4°F | Ht 68.0 in | Wt 177.9 lb

## 2015-09-19 DIAGNOSIS — Z0000 Encounter for general adult medical examination without abnormal findings: Secondary | ICD-10-CM | POA: Diagnosis not present

## 2015-09-19 NOTE — Progress Notes (Signed)
Pre visit review using our clinic review tool, if applicable. No additional management support is needed unless otherwise documented below in the visit note. 

## 2015-09-19 NOTE — Progress Notes (Signed)
   Subjective:    Patient ID: Neil Cox, male    DOB: 1966/05/29, 50 y.o.   MRN: [id number]  HPI Patient here for complete physical. 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 He continues to see dermatologist yearly. He's never had any recurrent skin cancer. Nonsmoker. Exercises regularly. Has had some poor compliance with diet over the past year has gained some weight. Very strong family history of type 2 diabetes as below. Takes no medications  Past Medical History  Diagnosis Date  . Arthritis   . Cancer ([place])   . Allergy   . Hypertension    No past surgical history on file.  reports that he has never smoked. He does not have any smokeless tobacco history on file. He reports that he drinks alcohol. He reports that he does not use illicit drugs. family history includes Arthritis in his father and paternal grandmother; Diabetes in his brother and father; Hypertension in his paternal grandmother. No Known Allergies    Review of Systems  Constitutional: Negative for fever, activity change, appetite change and fatigue.  HENT: Negative for congestion, ear pain and trouble swallowing.   Eyes: Negative for pain and visual disturbance.  Respiratory: Negative for cough, shortness of breath and wheezing.   Cardiovascular: Negative for chest pain and palpitations.  Gastrointestinal: Negative for nausea, vomiting, abdominal pain, diarrhea, constipation, blood in stool, abdominal distention and rectal pain.  Genitourinary: Negative for dysuria, hematuria and testicular pain.  Musculoskeletal: Negative for joint swelling and arthralgias.  Skin: Negative for rash.  Neurological: Negative for dizziness, syncope and headaches.  Hematological: Negative for adenopathy.  Psychiatric/Behavioral: Negative for confusion and dysphoric mood.       Objective:   Physical Exam  Constitutional: He is oriented to person, place, and time. He appears  well-developed and well-nourished. No distress.  HENT:  Head: Normocephalic and atraumatic.  Right Ear: External ear normal.  Left Ear: External ear normal.  Mouth/Throat: Oropharynx is clear and moist.  Eyes: Conjunctivae and EOM are normal. Pupils are equal, round, and reactive to light.  Neck: Normal range of motion. Neck supple. No thyromegaly present.  Cardiovascular: Normal rate, regular rhythm and normal heart sounds.   No murmur heard. Pulmonary/Chest: No respiratory distress. He has no wheezes. He has no rales.  Abdominal: Soft. Bowel sounds are normal. He exhibits no distension and no mass. There is no tenderness. There is no rebound and no guarding.  Musculoskeletal: He exhibits no edema.  Lymphadenopathy:    He has no cervical adenopathy.  Neurological: He is alert and oriented to person, place, and time. He displays normal reflexes. No cranial nerve deficit.  Skin: No rash noted.  Psychiatric: He has a normal mood and affect.          Assessment & Plan:  Physical exam. Labs reviewed. Glucose 106 and this has always been normal previously. We have advocated he try to lose some weight and continue regular exercise habits. Consider A1c at follow-up. Immunizations up-to-date. We discussed colon cancer screening and prostate cancer screening at age 20 and sooner if symptomatic

## 2015-10-24 DIAGNOSIS — Z85828 Personal history of other malignant neoplasm of skin: Secondary | ICD-10-CM | POA: Diagnosis not present

## 2015-10-24 DIAGNOSIS — L570 Actinic keratosis: Secondary | ICD-10-CM | POA: Diagnosis not present

## 2015-10-24 DIAGNOSIS — L814 Other melanin hyperpigmentation: Secondary | ICD-10-CM | POA: Diagnosis not present

## 2016-05-21 DIAGNOSIS — D485 Neoplasm of uncertain behavior of skin: Secondary | ICD-10-CM | POA: Diagnosis not present

## 2016-05-21 DIAGNOSIS — L570 Actinic keratosis: Secondary | ICD-10-CM | POA: Diagnosis not present

## 2016-08-08 ENCOUNTER — Encounter: Payer: Self-pay | Admitting: Family

## 2016-08-08 ENCOUNTER — Ambulatory Visit (INDEPENDENT_AMBULATORY_CARE_PROVIDER_SITE_OTHER): Payer: 59 | Admitting: Family

## 2016-08-08 VITALS — BP 123/80 | HR 64 | Temp 97.2°F | Ht 68.0 in | Wt 181.8 lb

## 2016-08-08 DIAGNOSIS — M25551 Pain in right hip: Secondary | ICD-10-CM | POA: Diagnosis not present

## 2016-08-08 DIAGNOSIS — M25552 Pain in left hip: Secondary | ICD-10-CM

## 2016-08-08 MED ORDER — PREDNISONE 10 MG (21) PO TBPK: ORAL_TABLET | ORAL | 0 refills | Status: DC

## 2016-08-08 MED ORDER — NAPROXEN 500 MG PO TABS: 500.0000 mg | ORAL_TABLET | Freq: Two times a day (BID) | ORAL | 1 refills | Status: DC

## 2016-08-08 NOTE — Progress Notes (Signed)
   Subjective:    Patient ID: Neil Cox, male    DOB: 03-19-1966, 50 y.o.   MRN: [id number]  Hip Pain   The incident occurred more than 1 week ago. There was no injury mechanism. The pain is present in the left hip. The quality of the pain is described as aching. The pain is moderate. The pain has been intermittent since onset. Pertinent negatives include no muscle weakness, numbness or tingling. He reports no foreign bodies present. The symptoms are aggravated by movement. He has tried acetaminophen and NSAIDs for the symptoms. The treatment provided mild relief.      Review of Systems  Neurological: Negative for tingling and numbness.  All other systems reviewed and are negative.      Objective:   Physical Exam  Constitutional: He is oriented to person, place, and time. He appears well-developed and well-nourished. No distress.  HENT:  Head: Normocephalic.  Cardiovascular: Normal rate, regular rhythm, normal heart sounds and intact distal pulses.   No murmur heard. Pulmonary/Chest: Effort normal and breath sounds normal. No respiratory distress. He has no wheezes.  Abdominal: Soft. Bowel sounds are normal. He exhibits no distension. There is no tenderness.  Musculoskeletal: Normal range of motion. He exhibits tenderness (pain in left hip with flexion). He exhibits no edema.  Neurological: He is alert and oriented to person, place, and time.  Skin: Skin is warm and dry. No rash noted. No erythema.  Psychiatric: He has a normal mood and affect. His behavior is normal. Judgment and thought content normal.  Vitals reviewed.     BP 123/80   Pulse 64   Temp 97.2 F (36.2 C) (Oral)   Ht 5\' 8"  (1.727 m)   Wt 181 lb 12.8 oz (82.5 kg)   BMI 27.64 kg/m      Assessment & Plan:  1. Pain of both hip joints -Rest -ROM exercises discussed -Ice and heat RTO prn  - naproxen (NAPROSYN) 500 MG tablet; Take 1 tablet (500 mg total) by mouth 2 (two) times daily with a meal.   Dispense: 60 tablet; Refill: 1 - predniSONE (STERAPRED UNI-PAK 21 TAB) 10 MG (21) TBPK tablet; Use as directed  Dispense: 21 tablet; Refill: 0  Evelina Dun, FNP

## 2016-08-08 NOTE — Patient Instructions (Signed)
Hip Exercises Ask your health care provider which exercises are safe for you. Do exercises exactly as told by your health care provider and adjust them as directed. It is normal to feel mild stretching, pulling, tightness, or discomfort as you do these exercises, but you should stop right away if you feel sudden pain or your pain gets worse.Do not begin these exercises until told by your health care provider. STRETCHING AND RANGE OF MOTION EXERCISES  These exercises warm up your muscles and joints and improve the movement and flexibility of your hip. These exercises also help to relieve pain, numbness, and tingling. Exercise A: Hamstrings, Supine  1. Lie on your back. 2. Loop a belt or towel over the ball of your left / rightfoot. The ball of your foot is on the walking surface, right under your toes. 3. Straighten your left / rightknee and slowly pull on the belt to raise your leg.  Do not let your left / right knee bend while you do this.  Keep your other leg flat on the floor.  Raise the left / right leg until you feel a gentle stretch behind your left / right knee or thigh. 4. Hold this position for __________ seconds. 5. Slowly return your leg to the starting position. Repeat __________ times. Complete this stretch __________ times a day. Exercise B: Hip Rotators  1. Lie on your back on a firm surface. 2. Hold your left / right knee with your left / right hand. Hold your ankle with your other hand. 3. Gently pull your left / right knee and rotate your lower leg toward your other shoulder.  Pull until you feel a stretch in your buttocks.  Keep your hips and shoulders firmly planted while you do this stretch. 4. Hold this position for __________ seconds. Repeat __________ times. Complete this stretch __________ times a day. Exercise C: V-Sit (Hamstrings and Adductors)  1. Sit on the floor with your legs extended in a large "V" shape. Keep your knees straight during this  exercise. 2. Start with your head and chest upright, then bend at your waist to reach for your left foot (position A). You should feel a stretch in your right inner thigh. 3. Hold this position for __________ seconds. Then slowly return to the upright position. 4. Bend at your waist to reach forward (position B). You should feel a stretch behind both of your thighs and knees. 5. Hold this position for __________ seconds. Then slowly return to the upright position. 6. Bend at your waist to reach for your right foot (position C). You should feel a stretch in your left inner thigh. 7. Hold this position for __________ seconds. Then slowly return to the upright position. Repeat __________ times. Complete this stretch __________ times a day. Exercise D: Lunge (Hip Flexors)  1. Place your left / right knee on the floor and bend your other knee so that is directly over your ankle. You should be half-kneeling. 2. Keep good posture with your head over your shoulders. 3. Tighten your buttocks to point your tailbone downward. This helps your back to keep from arching too much. 4. You should feel a gentle stretch in the front of your left / right thigh and hip. If you do not feel any resistance, slightly slide your other foot forward and then slowly lunge forward so your knee once again lines up over your ankle. 5. Make sure your tailbone continues to point downward. 6. Hold this position for __________ seconds.  Repeat __________ times. Complete this stretch __________ times a day. STRENGTHENING EXERCISES  These exercises build strength and endurance in your hip. Endurance is the ability to use your muscles for a long time, even after they get tired. Exercise E: Bridge (Hip Extensors)  1. Lie on your back on a firm surface with your knees bent and your feet flat on the floor. 2. Tighten your buttocks muscles and lift your bottom off the floor until the trunk of your body is level with your thighs.  Do not  arch your back.  You should feel the muscles working in your buttocks and the back of your thighs. If you do not feel these muscles, slide your feet 1-2 inches (2.5-5 cm) farther away from your buttocks. 3. Hold this position for __________ seconds. 4. Slowly lower your hips to the starting position. 5. Let your muscles relax completely between repetitions. 6. If this exercise is too easy, try doing it with your arms crossed over your chest. Repeat __________ times. Complete this exercise __________ times a day. Exercise F: Straight Leg Raises - Hip Abductors  1. Lie on your side with your left / right leg in the top position. Lie so your head, shoulder, knee, and hip line up with each other. You may bend your bottom knee to help you balance. 2. Roll your hips slightly forward, so your hips are stacked directly over each other and your left / right knee is facing forward. 3. Leading with your heel, lift your top leg 4-6 inches (10-15 cm). You should feel the muscles in your outer hip lifting.  Do not let your foot drift forward.  Do not let your knee roll toward the ceiling. 4. Hold this position for __________ seconds. 5. Slowly return to the starting position. 6. Let your muscles relax completely between repetitions. Repeat __________ times. Complete this exercise __________ times a day. Exercise G: Straight Leg Raises - Hip Adductors  1. Lie on your side with your left / right leg in the bottom position. Lie so your head, shoulder, knee, and hip line up. You may place your upper foot in front to help you balance. 2. Roll your hips slightly forward, so your hips are stacked directly over each other and your left / right knee is facing forward. 3. Tense the muscles in your inner thigh and lift your bottom leg 4-6 inches (10-15 cm). 4. Hold this position for __________ seconds. 5. Slowly return to the starting position. 6. Let your muscles relax completely between repetitions. Repeat  __________ times. Complete this exercise __________ times a day. Exercise H: Straight Leg Raises - Quadriceps  1. Lie on your back with your left / right leg extended and your other knee bent. 2. Tense the muscles in the front of your left / right thigh. When you do this, you should see your kneecap slide up or see increased dimpling just above your knee. 3. Tighten these muscles even more and raise your leg 4-6 inches (10-15 cm) off the floor. 4. Hold this position for __________ seconds. 5. Keep these muscles tense as you lower your leg. 6. Relax the muscles slowly and completely between repetitions. Repeat __________ times. Complete this exercise __________ times a day. Exercise I: Hip Abductors, Standing 1. Tie one end of a rubber exercise band or tubing to a secure surface, such as a table or pole. 2. Loop the other end of the band or tubing around your left / right ankle. 3. Keeping your  around your left / right ankle. 3. Keeping your ankle with the band or tubing directly opposite of the secured end, step away until there is tension in the tubing or band. Hold onto a chair as needed for balance. 4. Lift your left / right leg out to your side. While you do this:  Keep your back upright.  Keep your shoulders over your hips.  Keep your toes pointing forward.  Make sure to use your hip muscles to lift your leg. Do not "throw" your leg or tip your body to lift your leg. 5. Hold this position for __________ seconds. 6. Slowly return to the starting position. Repeat __________ times. Complete this exercise __________ times a day. Exercise J: Squats (Quadriceps)  1. Stand in a door frame so your feet and knees are in line with the frame. You may place your hands on the frame for balance. 2. Slowly bend your knees and lower your hips like you are going to sit in a chair.  Keep your lower legs in a straight-up-and-down position.  Do not let your hips go lower than your knees.  Do not bend your knees lower than  told by your health care provider.  If your hip pain increases, do not bend as low. 3. Hold this position for ___________ seconds. 4. Slowly push with your legs to return to standing. Do not use your hands to pull yourself to standing. Repeat __________ times. Complete this exercise __________ times a day. This information is not intended to replace advice given to you by your health care provider. Make sure you discuss any questions you have with your health care provider. Document Released: 08/15/2005 Document Revised: 04/21/2016 Document Reviewed: 07/23/2015 Elsevier Interactive Patient Education  2017 [employer]  

## 2016-10-29 DIAGNOSIS — L570 Actinic keratosis: Secondary | ICD-10-CM | POA: Diagnosis not present

## 2016-10-29 DIAGNOSIS — L814 Other melanin hyperpigmentation: Secondary | ICD-10-CM | POA: Diagnosis not present

## 2016-10-29 DIAGNOSIS — Z85828 Personal history of other malignant neoplasm of skin: Secondary | ICD-10-CM | POA: Diagnosis not present

## 2016-10-29 DIAGNOSIS — L821 Other seborrheic keratosis: Secondary | ICD-10-CM | POA: Diagnosis not present

## 2016-10-29 DIAGNOSIS — D1800 Hemangioma unspecified site: Secondary | ICD-10-CM | POA: Diagnosis not present

## 2016-10-29 DIAGNOSIS — Z23 Encounter for immunization: Secondary | ICD-10-CM | POA: Diagnosis not present

## 2016-10-29 DIAGNOSIS — D225 Melanocytic nevi of trunk: Secondary | ICD-10-CM | POA: Diagnosis not present

## 2017-03-25 ENCOUNTER — Encounter: Payer: 59 | Admitting: Family Medicine

## 2017-04-15 ENCOUNTER — Encounter: Payer: 59 | Admitting: Family Medicine

## 2017-04-29 ENCOUNTER — Encounter: Payer: 59 | Admitting: Family Medicine

## 2017-04-30 ENCOUNTER — Encounter: Payer: Self-pay | Admitting: Family Medicine

## 2017-05-04 ENCOUNTER — Encounter: Payer: Self-pay | Admitting: Family Medicine

## 2017-05-04 ENCOUNTER — Ambulatory Visit (INDEPENDENT_AMBULATORY_CARE_PROVIDER_SITE_OTHER): Payer: 59 | Admitting: Family Medicine

## 2017-05-04 VITALS — BP 115/60 | HR 61 | Temp 97.8°F | Ht 68.0 in | Wt 182.2 lb

## 2017-05-04 DIAGNOSIS — Z0000 Encounter for general adult medical examination without abnormal findings: Secondary | ICD-10-CM | POA: Diagnosis not present

## 2017-05-04 LAB — CBC WITH DIFFERENTIAL/PLATELET
Basophils Absolute: 0 10*3/uL (ref 0.0–0.1)
Basophils Relative: 0.4 % (ref 0.0–3.0)
EOS PCT: 1.3 % (ref 0.0–5.0)
Eosinophils Absolute: 0.1 10*3/uL (ref 0.0–0.7)
HCT: 43.5 % (ref 39.0–52.0)
HEMOGLOBIN: 14.7 g/dL (ref 13.0–17.0)
Lymphocytes Relative: 36.5 % (ref 12.0–46.0)
Lymphs Abs: 1.6 10*3/uL (ref 0.7–4.0)
MCHC: 33.8 g/dL (ref 30.0–36.0)
MCV: 87.7 fl (ref 78.0–100.0)
MONOS PCT: 6.7 % (ref 3.0–12.0)
Monocytes Absolute: 0.3 10*3/uL (ref 0.1–1.0)
Neutro Abs: 2.4 10*3/uL (ref 1.4–7.7)
Neutrophils Relative %: 55.1 % (ref 43.0–77.0)
Platelets: 191 10*3/uL (ref 150.0–400.0)
RBC: 4.96 Mil/uL (ref 4.22–5.81)
RDW: 14.1 % (ref 11.5–15.5)
WBC: 4.3 10*3/uL (ref 4.0–10.5)

## 2017-05-04 LAB — LIPID PANEL
Cholesterol: 178 mg/dL (ref 0–200)
HDL: 60.5 mg/dL (ref >39.00)
LDL Cholesterol: 107 mg/dL — ABNORMAL HIGH (ref 0–99)
NonHDL: 117.64
Total CHOL/HDL Ratio: 3
Triglycerides: 55 mg/dL (ref 0.0–149.0)
VLDL: 11 mg/dL (ref 0.0–40.0)

## 2017-05-04 LAB — BASIC METABOLIC PANEL
BUN: 18 mg/dL (ref 6–23)
CALCIUM: 9.3 mg/dL (ref 8.4–10.5)
CO2: 30 mEq/L (ref 19–32)
Chloride: 102 mEq/L (ref 96–112)
Creatinine, Ser: 0.96 mg/dL (ref 0.40–1.50)
GFR: 88.01 mL/min (ref >60.00)
Glucose, Bld: 102 mg/dL — ABNORMAL HIGH (ref 70–99)
Potassium: 4.5 mEq/L (ref 3.5–5.1)
Sodium: 138 mEq/L (ref 135–145)

## 2017-05-04 LAB — HEPATIC FUNCTION PANEL
ALT: 13 U/L (ref 0–53)
AST: 18 U/L (ref 0–37)
Albumin: 4.3 g/dL (ref 3.5–5.2)
Alkaline Phosphatase: 41 U/L (ref 39–117)
Bilirubin, Direct: 0.1 mg/dL (ref 0.0–0.3)
TOTAL PROTEIN: 6.7 g/dL (ref 6.0–8.3)
Total Bilirubin: 0.8 mg/dL (ref 0.2–1.2)

## 2017-05-04 LAB — PSA: PSA: 0.28 ng/mL (ref 0.10–4.00)

## 2017-05-04 LAB — TSH: TSH: 0.74 u[IU]/mL (ref 0.35–4.50)

## 2017-05-04 NOTE — Patient Instructions (Signed)
We will set up colonoscopy referral Consider new shingles vaccine- check on coverage first.

## 2017-05-04 NOTE — Progress Notes (Signed)
Subjective:     Patient ID: Neil Cox, male   DOB: September 25, 1965, 51 y.o.   MRN: [id number]  HPI  Patient seen for physical exam. 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 Just recently bought a new home. Marland Kitchen Has been less diligent with exercise recently because of the move. Does not take any medications. No chronic medical problems. Nonsmoker. Tetanus up-to-date. Plans to get flu vaccine through his work. Just turned 51 this year. No history of screening colonoscopy. No history of shingles vaccine.  Past Medical History:  Diagnosis Date  . Allergy   . Arthritis   . Cancer ([place])   . Hypertension    No past surgical history on file.  reports that he has never smoked. He has quit using smokeless tobacco. He reports that he drinks alcohol. He reports that he does not use drugs. family history includes Arthritis in his father and paternal grandmother; Diabetes in his brother and father; Hypertension in his paternal grandmother. No Known Allergies  Review of Systems  Constitutional: Negative for activity change, appetite change, fatigue and fever.  HENT: Negative for congestion, ear pain and trouble swallowing.   Eyes: Negative for pain and visual disturbance.  Respiratory: Negative for cough, shortness of breath and wheezing.   Cardiovascular: Negative for chest pain and palpitations.  Gastrointestinal: Negative for abdominal distention, abdominal pain, blood in stool, constipation, diarrhea, nausea, rectal pain and vomiting.  Endocrine: Negative for polydipsia and polyuria.  Genitourinary: Negative for dysuria, hematuria and testicular pain.  Musculoskeletal: Negative for arthralgias and joint swelling.  Skin: Negative for rash.  Neurological: Negative for dizziness, syncope and headaches.  Hematological: Negative for adenopathy.  Psychiatric/Behavioral: Negative for confusion and dysphoric mood.       Objective:   Physical Exam  Constitutional: He is oriented to person,  place, and time. He appears well-developed and well-nourished. No distress.  HENT:  Head: Normocephalic and atraumatic.  Right Ear: External ear normal.  Left Ear: External ear normal.  Mouth/Throat: Oropharynx is clear and moist.  Eyes: Pupils are equal, round, and reactive to light. Conjunctivae and EOM are normal.  Neck: Normal range of motion. Neck supple. No thyromegaly present.  Cardiovascular: Normal rate, regular rhythm and normal heart sounds.   No murmur heard. Pulmonary/Chest: No respiratory distress. He has no wheezes. He has no rales.  Abdominal: Soft. Bowel sounds are normal. He exhibits no distension and no mass. There is no tenderness. There is no rebound and no guarding.  Musculoskeletal: He exhibits no edema.  Lymphadenopathy:    He has no cervical adenopathy.  Neurological: He is alert and oriented to person, place, and time. He displays normal reflexes. No cranial nerve deficit.  Skin: No rash noted.  Psychiatric: He has a normal mood and affect.       Assessment:     Physical exam. Generally healthy 51 year old male.    Plan:     -Obtain screening lab work -The natural history of prostate cancer and ongoing controversy regarding screening and potential treatment outcomes of prostate cancer has been discussed with the patient. The meaning of a false positive PSA and a false negative PSA has been discussed. He indicates understanding of the limitations of this screening test and wishes to proceed with screening PSA testing. -Set up screening colonoscopy -Discussed new shingles vaccine and he will check on insurance coverage -Get back to more consistent exercise and try to lose a few pounds  Eulas Post MD [facility]  at [facility]

## 2017-05-12 ENCOUNTER — Ambulatory Visit (INDEPENDENT_AMBULATORY_CARE_PROVIDER_SITE_OTHER): Payer: 59 | Admitting: Family

## 2017-05-12 ENCOUNTER — Encounter: Payer: Self-pay | Admitting: Family

## 2017-05-12 VITALS — BP 117/70 | HR 71 | Temp 98.5°F | Ht 68.0 in | Wt 181.0 lb

## 2017-05-12 DIAGNOSIS — J029 Acute pharyngitis, unspecified: Secondary | ICD-10-CM

## 2017-05-12 LAB — RAPID STREP SCREEN (MED CTR MEBANE ONLY): Strep Gp A Ag, IA W/Reflex: NEGATIVE

## 2017-05-12 LAB — CULTURE, GROUP A STREP

## 2017-05-12 NOTE — Progress Notes (Signed)
   Subjective:    Patient ID: Neil Cox, male    DOB: [birth year], 51 y.o.   MRN: [id number]  Sore Throat   This is a new problem. The current episode started in the past 7 days. The problem has been gradually worsening. The pain is worse on the left side. There has been no fever. The pain is at a severity of 5/10. The pain is moderate. Associated symptoms include trouble swallowing. Pertinent negatives include no congestion, coughing, ear discharge, ear pain or hoarse voice. He has had no exposure to strep. He has tried acetaminophen and gargles for the symptoms. The treatment provided mild relief.      Review of Systems  HENT: Positive for trouble swallowing. Negative for congestion, ear discharge, ear pain and hoarse voice.   Respiratory: Negative for cough.   All other systems reviewed and are negative.      Objective:   Physical Exam  Constitutional: He is oriented to person, place, and time. He appears well-developed and well-nourished. No distress.  HENT:  Head: Normocephalic.  Right Ear: External ear normal.  Left Ear: External ear normal.  Mouth/Throat: Posterior oropharyngeal erythema present.  Eyes: Pupils are equal, round, and reactive to light. Right eye exhibits no discharge. Left eye exhibits no discharge.  Neck: Normal range of motion. Neck supple. No thyromegaly present.  Cardiovascular: Normal rate, regular rhythm, normal heart sounds and intact distal pulses.   No murmur heard. Pulmonary/Chest: Effort normal and breath sounds normal. No respiratory distress. He has no wheezes.  Abdominal: Soft. Bowel sounds are normal. He exhibits no distension. There is no tenderness.  Musculoskeletal: Normal range of motion. He exhibits no edema or tenderness.  Neurological: He is alert and oriented to person, place, and time. He has normal reflexes. No cranial nerve deficit.  Skin: Skin is warm and dry. No rash noted. No erythema.  Psychiatric: He has a normal mood and  affect. His behavior is normal. Judgment and thought content normal.  Vitals reviewed.     BP 117/70   Pulse 71   Temp 98.5 F (36.9 C) (Oral)   Ht 5\' 8"  (1.727 m)   Wt 181 lb (82.1 kg)   BMI 27.52 kg/m      Assessment & Plan:  1. Sore throat - Rapid strep screen (not at [facility])  2. Acute pharyngitis, unspecified etiology - Take meds as prescribed - Use a cool mist humidifier  -Use saline nose sprays frequently -Force fluids -For fever or aces or pains- take tylenol or ibuprofen appropriate for age and weight.  * for fevers greater than 101 orally you may alternate ibuprofen and tylenol every  3 hours. -Throat lozenges if help -New toothbrush in 3 days Call if symptoms do not improve or worsen     Evelina Dun, FNP

## 2017-05-12 NOTE — Patient Instructions (Signed)

## 2017-05-13 ENCOUNTER — Telehealth: Payer: Self-pay | Admitting: Family

## 2017-05-13 ENCOUNTER — Telehealth: Payer: Self-pay | Admitting: *Deleted

## 2017-05-13 NOTE — Telephone Encounter (Signed)
Please advise 

## 2017-05-13 NOTE — Telephone Encounter (Signed)
Strep test was negative yesterday but Ms Neil Cox said she would send in antibiotic if it did not improve.  It has gotten worse . Please send in medicine to  [facility] in [place] or please have pm nurse call in for a quicker refill to be done.  Thanks

## 2017-05-14 ENCOUNTER — Encounter: Payer: Self-pay | Admitting: Sports Medicine

## 2017-05-14 MED ORDER — AMOXICILLIN 500 MG PO CAPS: 500.0000 mg | ORAL_CAPSULE | Freq: Two times a day (BID) | ORAL | 0 refills | Status: DC

## 2017-05-14 NOTE — Telephone Encounter (Signed)
I believe pt's PCP called him in Amoxicillin. Will you call and see if he is doing better since starting antibiotic?

## 2017-05-14 NOTE — Pre-Procedure Instructions (Signed)
Neil Cox reached out to me yesterday. He is well known to me through our church. He reported worsening throat pains, malaise and fatigue. EPIC reviewed and I agree with the plan to start ABx for presumed strep pharyngitis. Amoxicillin 500mg  bid x 10 days called into his pharmacy last night. He will follow up with Dr Elease Hashimoto (my partner) or with Sherre Scarlet as needed.

## 2017-05-14 NOTE — Telephone Encounter (Signed)
Neil Cox reached out to me yesterday. He is well known to me through our church. He reported worsening throat pains, malaise and fatigue. EPIC reviewed and I agree with the plan to start ABx for presumed strep pharyngitis. Amoxicillin 500mg  bid x 10 days called into his pharmacy last night. He will follow up with Dr Elease Hashimoto (my partner) or with Sherre Scarlet as needed.

## 2017-06-03 ENCOUNTER — Encounter: Payer: Self-pay | Admitting: Family Medicine

## 2017-06-19 ENCOUNTER — Encounter: Payer: Self-pay | Admitting: Internal Medicine

## 2017-06-22 ENCOUNTER — Telehealth: Payer: Self-pay | Admitting: Family Medicine

## 2017-06-22 NOTE — Telephone Encounter (Signed)
Pt would like to know if Dr Elease Hashimoto has a preference on who he should see for his colonoscopy. (any particular dr over there?)

## 2017-06-22 NOTE — Telephone Encounter (Signed)
I think any of them would be good choices

## 2017-06-23 NOTE — Telephone Encounter (Signed)
Copied from [place] [phone]. Topic: Referral - Request >> Jun 23, 2017  9:18 AM Pricilla Handler wrote: Reason for CRM: Patient called wanting to know if Dr. Elease Hashimoto has any particular provider that he wants him to have his colonoscopy with. Patient would like Dr. Elease Hashimoto or his nurse to call him ASAP. Patient wants an earlier colonoscopy appt in December as apposed to February.

## 2017-06-23 NOTE — Telephone Encounter (Signed)
Spoke with patient and gave Dr Erick Blinks recommendations and advised patient to get on the cancellation list at GI.

## 2017-07-09 ENCOUNTER — Encounter: Payer: Self-pay | Admitting: Emergency Medicine

## 2017-07-09 ENCOUNTER — Ambulatory Visit: Payer: Self-pay | Admitting: Emergency Medicine

## 2017-07-09 VITALS — BP 132/80 | HR 80 | Temp 98.3°F | Resp 18 | Wt 179.4 lb

## 2017-07-09 DIAGNOSIS — W19XXXA Unspecified fall, initial encounter: Secondary | ICD-10-CM

## 2017-07-09 DIAGNOSIS — R52 Pain, unspecified: Secondary | ICD-10-CM

## 2017-07-09 MED ORDER — PREDNISONE 10 MG PO TABS: ORAL_TABLET | ORAL | 0 refills | Status: DC

## 2017-07-09 MED ORDER — HYDROCODONE-ACETAMINOPHEN 10-325 MG PO TABS: 1.0000 | ORAL_TABLET | ORAL | 0 refills | Status: DC | PRN

## 2017-07-09 NOTE — Patient Instructions (Signed)
I have prescribed a medicine for pain called vicodine, this medicine is a narcotic, it will cause drowsiness, and it is addictive. Do not take more than what is necessary, do not drink alcohol while taking, and do not operate any heavy machinery while taking this medicine.  For the swelling I prescribed prednisone, take 6 tablets in the morning, then decrease by one tablet every other day till finished (6,6,5,5,4,4,3,3,2,2,1,1)  If symptoms worsen or fail to resolve follow up with your PCP, an orthopedist, or go to the ER. If you experience numbness, or loss of sensation in your extremities, go to the ER right away.   Ice the affected area 4 or more times a day. You make take over the counter tylenol with these medicines, avoid taking more than 4000 mg a day of tylenol as this can damage your liver.

## 2017-07-09 NOTE — Progress Notes (Signed)
[id number]  Arrival Time:    SUBJECTIVE:  Neil Cox is a 51 y.o. male who presents to [facility] with complaint of pain and swelling to the lower buttocks. Patient works as a Community education officer and states he believes he bruised his ischial tuberosity or ruptured the ischial bursa. He states he fell through the rafters of his attic landing on a support beam yesterday afternoon. He did not fall all the way to the ground, did not hit his head, had no LOC. He is not taking any blood thinners. He has been taking Naproxen 500 mg that has provided some pain relief. He has had no los of control of bowel or bladder function, no loss of sensation distally.      Past Medical History:  Diagnosis Date  . Allergy   . Arthritis   . Cancer ([place])   . Hypertension    Family History  Problem Relation Age of Onset  . Arthritis Father   . Diabetes Father   . Arthritis Paternal Grandmother   . Hypertension Paternal Grandmother   . Diabetes Brother    Social History   Socioeconomic History  . Marital status: Married    Spouse name: Not on file  . Number of children: Not on file  . Years of education: Not on file  . Highest education level: Not on file  Social Needs  . Financial resource strain: Not on file  . Food insecurity - worry: Not on file  . Food insecurity - inability: Not on file  . Transportation needs - medical: Not on file  . Transportation needs - non-medical: Not on file  Occupational History  . Not on file  Tobacco Use  . Smoking status: Never Smoker  . Smokeless tobacco: Former Network engineer and Sexual Activity  . Alcohol use: Yes    Comment: 6 pack per week  . Drug use: No  . Sexual activity: Not on file  Other Topics Concern  . Not on file  Social History Narrative  . Not on file   No outpatient medications have been marked as taking for the 07/09/17 encounter (Office Visit) with Barnet Glasgow, NP.   No Known Allergies    Review of Systems    Constitutional: Negative for chills and fever.  Eyes: Negative for blurred vision and double vision.  Respiratory: Negative for cough, hemoptysis, shortness of breath and wheezing.   Cardiovascular: Negative for chest pain and palpitations.  Gastrointestinal: Negative for constipation, diarrhea, nausea and vomiting.  Genitourinary: Negative for dysuria, frequency and urgency.  Musculoskeletal: Positive for back pain and joint pain.  Skin: Negative.   Neurological: Negative for seizures, loss of consciousness and headaches.  Endo/Heme/Allergies: Negative.     OBJECTIVE:   Vitals:   07/09/17 1854  BP: 132/80  Pulse: 80  Resp: 18  Temp: 98.3 F (36.8 C)  TempSrc: Oral  Weight: 179 lb 6.4 oz (81.4 kg)   Physical Exam  Constitutional: He is well-developed, well-nourished, and in no distress. Vital signs are normal.  Patient standing in the exam room, appearing uncomfortable.  HENT:  Head: Normocephalic and atraumatic.  Right Ear: External ear normal.  Left Ear: External ear normal.  Eyes: Conjunctivae are normal. Pupils are equal, round, and reactive to light.  Neck: Normal range of motion. Neck supple.  Cardiovascular: Normal rate and normal heart sounds.  Pulmonary/Chest: Effort normal and breath sounds normal. He exhibits no tenderness, no deformity and no swelling.  Abdominal: Soft.  Musculoskeletal:  Right upper leg: He exhibits tenderness and swelling.       Legs: Deferred assessment of straight leg raise, patellar reflexes due to pain of sitting  Neurological: He is alert. Gait normal. Coordination normal.  Skin: Skin is warm and dry.  Nursing note and vitals reviewed.    ASSESSMENT & PLAN:  1. Fall, initial encounter   2. Pain    Patient was notified of the lack of X-rays or advanced imaging capabilities and agreed to be seen. States he does not believe imaging is needed and is requesting prescription for prednisone. Counseling and strict followup  guidelines provided. Encouraged the patient to seek care at the ER if symptoms worsen, or if he experiences numbness in the extremities, or loss of control of bowel or bladder function.   1. Fall, initial encounter  - HYDROcodone-acetaminophen (NORCO) 10-325 MG tablet; Take 1-2 tablets by mouth every 4 (four) hours as needed.  Dispense: 5 tablet; Refill: 0 - predniSONE (DELTASONE) 10 MG tablet; Take 6 tablets in the morning, decrease by one tablet every other day till finished (6,6,5,5,4,4,3,3,2,2,1,1)  Dispense: 42 tablet; Refill: 0  2. Pain  - HYDROcodone-acetaminophen (NORCO) 10-325 MG tablet; Take 1-2 tablets by mouth every 4 (four) hours as needed.  Dispense: 5 tablet; Refill: 0, counseling provided on avoiding driving, other sedatives such as alcohol, or benzodiazepines, and for the possible side effect of constipation - predniSONE (DELTASONE) 10 MG tablet; Take 6 tablets in the morning, decrease by one tablet every other day till finished (6,6,5,5,4,4,3,3,2,2,1,1)  Dispense: 42 tablet; Refill: [place] controlled substances reporting system consulted prior to issuing prescription, with the following findings below:  No controlled substances have been written for within the last 2 years   Reviewed expectations re: course of current medical issues. Questions answered. Outlined signs and symptoms indicating need for more acute intervention. Patient verbalized understanding. After Visit Summary given.

## 2017-07-11 ENCOUNTER — Telehealth: Payer: Self-pay

## 2017-07-11 NOTE — Telephone Encounter (Signed)
I left a message asking the patient to call us back. 

## 2017-07-13 ENCOUNTER — Telehealth: Payer: Self-pay

## 2017-07-13 NOTE — Telephone Encounter (Signed)
I called this patient again, and left a message to call us back.

## 2017-07-20 ENCOUNTER — Encounter: Payer: 59 | Admitting: Internal Medicine

## 2017-09-02 ENCOUNTER — Ambulatory Visit (AMBULATORY_SURGERY_CENTER): Payer: Self-pay

## 2017-09-02 VITALS — Ht 68.0 in | Wt 182.0 lb

## 2017-09-02 DIAGNOSIS — Z1211 Encounter for screening for malignant neoplasm of colon: Secondary | ICD-10-CM

## 2017-09-02 MED ORDER — NA SULFATE-K SULFATE-MG SULF 17.5-3.13-1.6 GM/177ML PO SOLN: 1.0000 | Freq: Once | ORAL | 0 refills | Status: AC

## 2017-09-02 NOTE — Progress Notes (Signed)
Per pt, no allergies to soy or egg products.Pt not taking any weight loss meds or using  O2 at home.  Pt refused emmi video. 

## 2017-09-11 ENCOUNTER — Ambulatory Visit (AMBULATORY_SURGERY_CENTER): Payer: No Typology Code available for payment source | Admitting: Internal Medicine

## 2017-09-11 ENCOUNTER — Encounter: Payer: Self-pay | Admitting: Internal Medicine

## 2017-09-11 ENCOUNTER — Other Ambulatory Visit: Payer: Self-pay

## 2017-09-11 VITALS — BP 108/54 | HR 67 | Temp 98.4°F | Resp 14 | Ht 68.0 in | Wt 182.0 lb

## 2017-09-11 DIAGNOSIS — D123 Benign neoplasm of transverse colon: Secondary | ICD-10-CM | POA: Diagnosis not present

## 2017-09-11 DIAGNOSIS — Z1212 Encounter for screening for malignant neoplasm of rectum: Secondary | ICD-10-CM

## 2017-09-11 DIAGNOSIS — Z1211 Encounter for screening for malignant neoplasm of colon: Secondary | ICD-10-CM | POA: Diagnosis not present

## 2017-09-11 MED ORDER — SODIUM CHLORIDE 0.9 % IV SOLN: 500.0000 mL | Freq: Once | INTRAVENOUS | Status: DC

## 2017-09-11 NOTE — Progress Notes (Signed)
Pt's states no medical or surgical changes since previsit or office visit. 

## 2017-09-11 NOTE — Op Note (Signed)
[place] Patient Name: Neil Cox Procedure Date: 09/11/2017 10:53 AM MRN: [id number] Endoscopist: Jerene Bears , MD Age: 52 Referring MD:  Date of Birth: [birth year] Gender: Male Account #: [id number] Procedure:                Colonoscopy Indications:              Screening for colorectal malignant neoplasm, This                            is the patient's first colonoscopy Medicines:                Monitored Anesthesia Care Procedure:                Pre-Anesthesia Assessment:                           - Prior to the procedure, a History and Physical                            was performed, and patient medications and                            allergies were reviewed. The patient's tolerance of                            previous anesthesia was also reviewed. The risks                            and benefits of the procedure and the sedation                            options and risks were discussed with the patient.                            All questions were answered, and informed consent                            was obtained. Prior Anticoagulants: The patient has                            taken no previous anticoagulant or antiplatelet                            agents. ASA Grade Assessment: II - A patient with                            mild systemic disease. After reviewing the risks                            and benefits, the patient was deemed in                            satisfactory condition to undergo the procedure.  After obtaining informed consent, the colonoscope                            was passed under direct vision. Throughout the                            procedure, the patient's blood pressure, pulse, and                            oxygen saturations were monitored continuously. The                            Colonoscope was introduced through the anus and                            advanced to the the cecum,  identified by                            appendiceal orifice and ileocecal valve. The                            colonoscopy was performed without difficulty. The                            patient tolerated the procedure well. The quality                            of the bowel preparation was good. The ileocecal                            valve, appendiceal orifice, and rectum were                            photographed. Scope In: 11:03:29 AM Scope Out: 11:18:00 AM Scope Withdrawal Time: 0 hours 10 minutes 9 seconds  Total Procedure Duration: 0 hours 14 minutes 31 seconds  Findings:                 The digital rectal exam was normal.                           A 6 mm polyp was found in the transverse colon. The                            polyp was sessile. The polyp was removed with a                            cold snare. Resection and retrieval were complete.                           A few small-mouthed diverticula were found in the                            sigmoid colon.  Internal hemorrhoids were found during                            retroflexion. The hemorrhoids were small. Complications:            No immediate complications. Estimated Blood Loss:     Estimated blood loss was minimal. Impression:               - One 6 mm polyp in the transverse colon, removed                            with a cold snare. Resected and retrieved.                           - Diverticulosis in the sigmoid colon.                           - Small internal hemorrhoids. Recommendation:           - Patient has a contact number available for                            emergencies. The signs and symptoms of potential                            delayed complications were discussed with the                            patient. Return to normal activities tomorrow.                            Written discharge instructions were provided to the                            patient.                            - Resume previous diet.                           - Continue present medications.                           - Await pathology results.                           - Repeat colonoscopy is recommended. The                            colonoscopy date will be determined after pathology                            results from today's exam become available for                            review. Jerene Bears, MD 09/11/2017 11:20:19 AM This report has been signed electronically.

## 2017-09-11 NOTE — Patient Instructions (Signed)
YOU HAD AN ENDOSCOPIC PROCEDURE TODAY AT [facility]:   Refer to the procedure report that was given to you for any specific questions about what was found during the examination.  If the procedure report does not answer your questions, please call your gastroenterologist to clarify.  If you requested that your care partner not be given the details of your procedure findings, then the procedure report has been included in a sealed envelope for you to review at your convenience later.  YOU SHOULD EXPECT: Some feelings of bloating in the abdomen. Passage of more gas than usual.  Walking can help get rid of the air that was put into your GI tract during the procedure and reduce the bloating. If you had a lower endoscopy (such as a colonoscopy or flexible sigmoidoscopy) you may notice spotting of blood in your stool or on the toilet paper. If you underwent a bowel prep for your procedure, you may not have a normal bowel movement for a few days.  Please Note:  You might notice some irritation and congestion in your nose or some drainage.  This is from the oxygen used during your procedure.  There is no need for concern and it should clear up in a day or so.  SYMPTOMS TO REPORT IMMEDIATELY:   Following lower endoscopy (colonoscopy or flexible sigmoidoscopy):  Excessive amounts of blood in the stool  Significant tenderness or worsening of abdominal pains  Swelling of the abdomen that is new, acute  Fever of 100F or higher  For urgent or emergent issues, a gastroenterologist can be reached at any hour by calling [phone].   DIET:  We do recommend a small meal at first, but then you may proceed to your regular diet.  Drink plenty of fluids but you should avoid alcoholic beverages for 24 hours.  ACTIVITY:  You should plan to take it easy for the rest of today and you should NOT DRIVE or use heavy machinery until tomorrow (because of the sedation medicines used during the test).     FOLLOW UP: Our staff will call the number listed on your records the next business day following your procedure to check on you and address any questions or concerns that you may have regarding the information given to you following your procedure. If we do not reach you, we will leave a message.  However, if you are feeling well and you are not experiencing any problems, there is no need to return our call.  We will assume that you have returned to your regular daily activities without incident.  If any biopsies were taken you will be contacted by phone or by letter within the next 1-3 weeks.  Please call us at [phone] if you have not heard about the biopsies in 3 weeks.   Await for biopsy results to determine next repeat Colonoscopy screening Polyps (handout given) Hemorrhoids (handout given) Diverticulosis (handout given)  SIGNATURES/CONFIDENTIALITY: You and/or your care partner have signed paperwork which will be entered into your electronic medical record.  These signatures attest to the fact that that the information above on your After Visit Summary has been reviewed and is understood.  Full responsibility of the confidentiality of this discharge information lies with you and/or your care-partner.

## 2017-09-11 NOTE — Progress Notes (Signed)
Report to PACU, RN, vss, BBS= Clear.  

## 2017-09-11 NOTE — Progress Notes (Signed)
Called to room to assist during endoscopic procedure.  Patient ID and intended procedure confirmed with present staff. Received instructions for my participation in the procedure from the performing physician.  

## 2017-09-14 ENCOUNTER — Telehealth: Payer: Self-pay

## 2017-09-14 ENCOUNTER — Telehealth: Payer: Self-pay | Admitting: *Deleted

## 2017-09-14 NOTE — Telephone Encounter (Signed)
No answer. Number identifier. Message left to call if questions or concerns. 

## 2017-09-14 NOTE — Telephone Encounter (Signed)
  Follow up Call-  Call back number 09/11/2017  Post procedure Call Back phone  # [phone]  Permission to leave phone message Yes  Some recent data might be hidden     No ID on answering machine Angela/Follow-up call

## 2017-09-16 ENCOUNTER — Encounter: Payer: Self-pay | Admitting: Internal Medicine

## 2017-10-02 ENCOUNTER — Telehealth: Payer: Self-pay | Admitting: Family Medicine

## 2017-10-02 DIAGNOSIS — M79673 Pain in unspecified foot: Secondary | ICD-10-CM

## 2017-10-02 NOTE — Telephone Encounter (Signed)
Ok to refer.

## 2017-10-02 NOTE — Telephone Encounter (Signed)
Copied from [place]. Topic: Referral - Request >> Oct 02, 2017  2:22 PM Darl Householder, RMA wrote: Reason for CRM: patient is requesting a referral to Dr. Hector Shade at [facility] for foot ortho

## 2017-10-06 NOTE — Telephone Encounter (Signed)
Referral placed.

## 2017-10-12 ENCOUNTER — Encounter: Payer: Self-pay | Admitting: Sports Medicine

## 2017-10-12 ENCOUNTER — Ambulatory Visit: Payer: No Typology Code available for payment source | Admitting: Sports Medicine

## 2017-10-12 ENCOUNTER — Encounter: Payer: No Typology Code available for payment source | Admitting: Sports Medicine

## 2017-10-12 VITALS — BP 128/74 | Ht 68.0 in | Wt 175.0 lb

## 2017-10-12 DIAGNOSIS — M216X9 Other acquired deformities of unspecified foot: Secondary | ICD-10-CM

## 2017-10-12 NOTE — Progress Notes (Signed)
   Subjective:    Patient ID: Neil Cox, male    DOB: [birth year], 52 y.o.   MRN: [id number]  HPI chief complaint: Bilateral foot pain  Very pleasant 52 year old comes in today requesting new custom orthotics. He has had custom orthotics before. His current set of orthotics were constructed in 2015. He has a history of plantar fasciitis and Morton's neuroma. His pain is starting to return although it is not severe. He has been very pleased with his previous pairs of orthotics.  Past medical history reviewed Medications reviewed Allergies reviewed    Review of Systems    as above Objective:   Physical Exam  Well-developed, well-nourished. No acute distress. Awake alert and oriented 3. Vital signs reviewed  Examination of his feet shows a rigid cavus foot. No significant tenderness to palpation at the calcaneal origin of the plantar fascia. Negative calcaneal squeeze. He has callus buildup at the heels and metatarsal heads. No soft tissue swelling. Walks without a limp.      Assessment & Plan:   Rigid cavus foot History of plantar fasciitis History of Morton's neuroma  New custom orthotics were constructed today. Patient found them to be comfortable prior to leaving the office. Total time spent with the patient was 30 minutes with greater than 50% of the time spent in face-to-face consultation discussing orthotic construction, instruction, and fitting. Continue with activity as tolerated and follow-up with me as needed.  Patient was fitted for a : standard, cushioned, semi-rigid orthotic. The orthotic was heated and afterward the patient stood on the orthotic blank positioned on the orthotic stand. The patient was positioned in subtalar neutral position and 10 degrees of ankle dorsiflexion in a weight bearing stance. After completion of molding, a stable base was applied to the orthotic blank. The blank was ground to a stable position for weight bearing. Size: 10 Base: Blue  EVA Posting: Fifth ray post on the left Additional orthotic padding: B/L MT pads

## 2018-03-15 ENCOUNTER — Telehealth: Payer: Self-pay | Admitting: Family Medicine

## 2018-03-15 DIAGNOSIS — Z1283 Encounter for screening for malignant neoplasm of skin: Secondary | ICD-10-CM

## 2018-03-15 NOTE — Telephone Encounter (Signed)
Referral placed.

## 2018-03-15 NOTE — Telephone Encounter (Signed)
OK 

## 2018-03-15 NOTE — Telephone Encounter (Signed)
Okay to refer? 

## 2018-03-15 NOTE — Telephone Encounter (Signed)
Copied from [place] [phone]. Topic: Referral - Request >> Mar 15, 2018  9:36 AM Robina Ade, Helene Kelp D wrote: Reason for CRM: Patient called and he would like a referral to [facility]. Please call patient back if needed. He would like to be seen for annual skin check.

## 2018-05-11 ENCOUNTER — Telehealth: Payer: Self-pay | Admitting: Family Medicine

## 2018-05-11 ENCOUNTER — Other Ambulatory Visit: Payer: Self-pay

## 2018-05-11 DIAGNOSIS — C44621 Squamous cell carcinoma of skin of unspecified upper limb, including shoulder: Secondary | ICD-10-CM

## 2018-05-11 NOTE — Telephone Encounter (Signed)
ok 

## 2018-05-11 NOTE — Telephone Encounter (Signed)
Copied from [place] [phone]. Topic: Referral - Request >> May 11, 2018  3:35 PM Judyann Munson wrote: Reason for CRM: Patient called and he would like a referral to  [place]. [place], MD- [facility] [address], [place], [place] [address] [phone]  . Please call patient back if needed. He would like to be seen for annual skin check.

## 2018-05-11 NOTE — Telephone Encounter (Signed)
Referral has been sent.

## 2018-05-11 NOTE — Telephone Encounter (Signed)
Last OV 05/04/17, No future OV  Please advise if okay for referral?

## 2018-08-25 ENCOUNTER — Ambulatory Visit: Payer: No Typology Code available for payment source | Admitting: Family Medicine

## 2018-08-25 ENCOUNTER — Ambulatory Visit (INDEPENDENT_AMBULATORY_CARE_PROVIDER_SITE_OTHER): Payer: No Typology Code available for payment source | Admitting: Family Medicine

## 2018-08-25 ENCOUNTER — Encounter: Payer: Self-pay | Admitting: Family Medicine

## 2018-08-25 VITALS — BP 120/82 | HR 69 | Temp 97.9°F | Ht 68.0 in | Wt 176.1 lb

## 2018-08-25 DIAGNOSIS — F5101 Primary insomnia: Secondary | ICD-10-CM | POA: Diagnosis not present

## 2018-08-25 MED ORDER — TRAZODONE HCL 50 MG PO TABS: 25.0000 mg | ORAL_TABLET | Freq: Every evening | ORAL | 3 refills | Status: DC | PRN

## 2018-08-25 NOTE — Patient Instructions (Signed)

## 2018-08-25 NOTE — Progress Notes (Signed)
  Subjective:     Patient ID: Neil Cox, male   DOB: 15-Nov-1965, 53 y.o.   MRN: [id number]  HPI Patient is seen with chief complaint of insomnia.  Has had some intermittent problems in the past but especially worsening past 2 to 3 weeks.  He denies any new stressors.  He has felt somewhat more anxious but no specific stressors.  He has had some difficulty both falling asleep and staying asleep.  Denies any depression symptoms.  He tried up to 2 Benadryl and had also taken some leftover Flexeril without improvement.  Does drink some caffeine- usually about 3 cups/day.    Past Medical History:  Diagnosis Date  . Allergy   . Arthritis   . Cancer ([place]) 2001   right thumb cancer/squamous cell  . DDD (degenerative disc disease), lumbosacral    L3-L4  . GERD (gastroesophageal reflux disease)   . Hypertension    in past   Past Surgical History:  Procedure Laterality Date  . EXCISION MORTON'S NEUROMA     30 years ago  . thumb surg     had cancer right thumb  . UPPER GASTROINTESTINAL ENDOSCOPY     occasional GERD/no meds    reports that he has never smoked. He quit smokeless tobacco use about 18 years ago.  His smokeless tobacco use included chew. He reports that he does not drink alcohol or use drugs. family history includes Arthritis in his father and paternal grandmother; Diabetes in his brother and father; Hypertension in his paternal grandmother; Osteoarthritis in his mother. No Known Allergies   Review of Systems  Psychiatric/Behavioral: Positive for sleep disturbance. Negative for agitation and dysphoric mood. The patient is nervous/anxious.        Objective:   Physical Exam Constitutional:      Appearance: Normal appearance.  Cardiovascular:     Rate and Rhythm: Normal rate and regular rhythm.  Pulmonary:     Effort: Pulmonary effort is normal.     Breath sounds: Normal breath sounds.  Neurological:     Mental Status: He is alert.  Psychiatric:        Mood and  Affect: Mood normal.        Thought Content: Thought content normal.        Assessment:     Transient insomnia.    Plan:     -Sleep hygiene discussed with handout given -We recommend consider short-term trial of trazodone 50 mg at night as needed -Avoid caffeine after about 1 to 2 PM -Continue regular exercise -He was given name of our counselor if he continues to have anxiety symptoms  Neil Post MD [facility] at [facility]

## 2018-12-27 ENCOUNTER — Telehealth: Payer: Self-pay | Admitting: *Deleted

## 2018-12-27 NOTE — Telephone Encounter (Signed)
Copied from [place] [phone]. Topic: Referral - Request for Referral >> Dec 27, 2018  1:51 PM Virl Axe D wrote: Has patient seen PCP for this complaint? No *If NO, is insurance requiring patient see PCP for this issue before PCP can refer them? Referral for which specialty:  Preferred provider/office: Dr. Oneida Alar / [facility] Reason for referral: Foot pain / Neuroma / Tendonitis

## 2018-12-27 NOTE — Telephone Encounter (Signed)
Called patient and LMOVM to return call  [place] for [facility] to Discuss results / PCP / recommendations / Schedule patient  Wanted to know if patient has seen Dr. Eden Lathe because there is already a referral from 09/2017 for this doctor. Also may need to set up a [place] visit to update information for referral if patient has not seen Dr. Eden Lathe yet.  CRM Created.

## 2018-12-28 NOTE — Telephone Encounter (Signed)
Virtual/telephone visit scheduled for 12/29/2018

## 2018-12-29 ENCOUNTER — Other Ambulatory Visit: Payer: Self-pay

## 2018-12-29 ENCOUNTER — Encounter: Payer: Self-pay | Admitting: Sports Medicine

## 2018-12-29 ENCOUNTER — Ambulatory Visit: Payer: No Typology Code available for payment source | Admitting: Sports Medicine

## 2018-12-29 ENCOUNTER — Ambulatory Visit (INDEPENDENT_AMBULATORY_CARE_PROVIDER_SITE_OTHER): Payer: No Typology Code available for payment source | Admitting: Family Medicine

## 2018-12-29 VITALS — BP 124/72 | Ht 68.0 in | Wt 175.0 lb

## 2018-12-29 DIAGNOSIS — M25571 Pain in right ankle and joints of right foot: Secondary | ICD-10-CM

## 2018-12-29 DIAGNOSIS — S86891A Other injury of other muscle(s) and tendon(s) at lower leg level, right leg, initial encounter: Secondary | ICD-10-CM

## 2018-12-29 DIAGNOSIS — S86811A Strain of other muscle(s) and tendon(s) at lower leg level, right leg, initial encounter: Secondary | ICD-10-CM

## 2018-12-29 NOTE — Progress Notes (Signed)
Patient ID: Neil Cox, male   DOB: 06-Jul-1966, 53 y.o.   MRN: [id number]  This visit type was conducted due to national recommendations for restrictions regarding the COVID-19 pandemic in an effort to limit this patient's exposure and mitigate transmission in our community.   Virtual Visit via Video Note  I connected with Neil Cox on 12/29/18 at  8:15 AM EDT by a video enabled telemedicine application and verified that I am speaking with the correct person using two identifiers.  Location patient: home Location provider:work or home office Persons participating in the virtual visit: patient, provider  I discussed the limitations of evaluation and management by telemedicine and the availability of in person appointments. The patient expressed understanding and agreed to proceed.   HPI:  Patient is requesting referral back to sports medicine.  He has had various lower extremity elements over the years including recurrent plantar fasciitis, history of Morton's neuroma, metatarsalgia, and currently battling with some shin splints on the right side.  Has been walking more than usual with recent pandemic and out of work past 2 months.  He has foot orthotics and is gotten these made for the past several years.  He has metatarsal pads.  1 month ago started having some right shin splint issues.  He is tried some taping and icing without much improvement.  Has curtailed his walking past couple weeks without much improvement.  Would like to see sports medicine again   ROS: See pertinent positives and negatives per HPI.  Past Medical History:  Diagnosis Date  . Allergy   . Arthritis   . Cancer ([place]) 2001   right thumb cancer/squamous cell  . DDD (degenerative disc disease), lumbosacral    L3-L4  . GERD (gastroesophageal reflux disease)   . Hypertension    in past    Past Surgical History:  Procedure Laterality Date  . EXCISION MORTON'S NEUROMA     30 years ago  . thumb surg     had cancer right thumb  . UPPER GASTROINTESTINAL ENDOSCOPY     occasional GERD/no meds    Family History  Problem Relation Age of Onset  . Arthritis Father   . Diabetes Father   . Osteoarthritis Mother   . Arthritis Paternal Grandmother   . Hypertension Paternal Grandmother   . Diabetes Brother   . Colon cancer Neg Hx   . Stomach cancer Neg Hx   . Rectal cancer Neg Hx     SOCIAL HX: Non-smoker.  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  Ascorbic Acid (VITA-C PO), Take 500 mg by mouth daily., Disp: , Rfl:  .  cholecalciferol (VITAMIN D) 1000 units tablet, Take 2,000 Units by mouth daily., Disp: , Rfl:  .  Multiple Vitamin (ONE-A-DAY MENS PO), Take by mouth daily., Disp: , Rfl:  .  naproxen sodium (ALEVE) 220 MG tablet, Take 220 mg by mouth as needed., Disp: , Rfl:  .  traZODone (DESYREL) 50 MG tablet, Take 0.5-1 tablets (25-50 mg total) by mouth at bedtime as needed for sleep., Disp: 30 tablet, Rfl: 3  Current Facility-Administered Medications:  .  0.9 %  sodium chloride infusion, 500 mL, Intravenous, Once, Pyrtle, Lajuan Lines, MD  EXAM:  VITALS per patient if applicable:  GENERAL: alert, oriented, appears well and in no acute distress  HEENT: atraumatic, conjunttiva clear, no obvious abnormalities on inspection of external nose and ears  NECK:  normal movements of the head and neck  LUNGS: on inspection no signs of respiratory distress, breathing rate appears normal, no obvious gross SOB, gasping or wheezing  CV: no obvious cyanosis  MS: moves all visible extremities without noticeable abnormality  PSYCH/NEURO: pleasant and cooperative, no obvious depression or anxiety, speech and thought processing grossly intact  ASSESSMENT AND PLAN:  Discussed the following assessment and plan:  Right medial tibial stress syndrome, initial encounter - Plan: Ambulatory referral to Sports Medicine  -Continue  with icing and gentle stretches -Sports medicine referral placed.  Patient has seen both Dr. Oneida Alar and Dr. Micheline Chapman in the past   I discussed the assessment and treatment plan with the patient. The patient was provided an opportunity to ask questions and all were answered. The patient agreed with the plan and demonstrated an understanding of the instructions.   The patient was advised to call back or seek an in-person evaluation if the symptoms worsen or if the condition fails to improve as anticipated.  Carolann Littler, MD

## 2018-12-29 NOTE — Progress Notes (Signed)
Subjective:    Patient ID: Neil Cox, male    DOB: [birth year], 53 y.o.   MRN: [id number]  HPI chief complaint: Right lower leg pain and right foot pain  [name] comes in today with a couple of different complaints.  Main complaint is right lower leg pain which has been present for about a month.  He has been doing a lot more walking recently and began to experience some increasing pain 4 weeks ago.  Pain continued to increase with recreational walking so two weeks ago he decided to take a break.  His symptoms have improved about 50% but he still has persistent discomfort that he localizes diffusely along the anterior lateral mid to distal lower leg.  Pain is primarily in the anterior tibialis muscle but he does also endorse some pain in the tibia.  Recently he began to experience some radiating pain into the ankle as well.  He has also noticed some mild swelling.  He tried an ankle ASO but it was uncomfortable.  He has also been doing some cross friction and taping of his lower leg which has been somewhat helpful.  Ice also seems to be helpful.  He took Aleve for several days but is currently not taking anything for pain.  He is also complaining of some returning pain secondary to a Morton's neuroma.  He has had a neuroma in the past.  He has started to workout on the elliptical more and as a result he is having increasing pain that is localized primarily between the third and fourth metatarsal heads with radiating pain into the toes and foot.  He has custom orthotics with a metatarsal pad These orthotics are about a year old.  Interim medical history reviewed Medications reviewed Allergies reviewed  Review of Systems As above    Objective:   Physical Exam  Well-developed, well-nourished.  No acute distress.  Awake alert and oriented x3.  Vital signs reviewed  Right lower leg: There is tenderness to palpation diffusely along the mid to distal anterior tibialis muscle.  Mild diffuse soft  tissue swelling in this area as well.  Some tenderness to palpation along the distal tibia.  Full ankle range of motion.  No ankle effusion.  Negative hop test. Right foot: No tenderness to palpation.  Patient does have reproducible pain when standing on his forefoot that he localizes between the third and fourth metatarsal heads.  MSK ultrasound of the right lower leg was performed.  Although there is diffuse soft tissue swelling along the distal lower leg, I do not see any discrete tear of the anterior tibialis muscle.  I also do not appreciate any cortical irregularity or fluid to suggest tibial stress fracture.      Assessment & Plan:   Right lower leg pain secondary to anterior tibialis strain  Patient's symptoms are already improving with relative rest.  I recommended that he continue with this for another couple of weeks.  When he returns to recreational walking, I recommend that he start at 50% of his weekly volume and increase by no more than 10 %/week.  I recommend a calf compression sleeve when active.  He may continue icing as well.  I would also like for him to start some eccentric anterior tibialis exercises.  His hop test and ultrasound are reassuring regarding stress fracture but if his symptoms persist or worsen then we may need to consider merits of further diagnostic imaging.   For his Morton's neuroma, we have  given him a neuroma pad for his shoes.  He will continue with his current custom orthotics.  I have also recommended that he stay off of the elliptical until symptoms have resolved as this puts a lot of pressure on his forefoot.  Follow-up for ongoing or recalcitrant issues.

## 2019-05-23 ENCOUNTER — Telehealth: Payer: Self-pay | Admitting: Family Medicine

## 2019-05-23 DIAGNOSIS — M79672 Pain in left foot: Secondary | ICD-10-CM

## 2019-05-23 NOTE — Telephone Encounter (Signed)
Pt called in to request a referral for left foot pain to see Dr Oneida Alar with [facility].

## 2019-05-23 NOTE — Telephone Encounter (Signed)
Ok to place referral.

## 2019-05-24 NOTE — Telephone Encounter (Signed)
Referral placed per Dr. Elease Hashimoto. Contacted pt and he was made aware. He had no additional questions. Nothing further is needed

## 2019-05-30 ENCOUNTER — Ambulatory Visit: Payer: No Typology Code available for payment source | Admitting: Family Medicine

## 2019-06-02 ENCOUNTER — Ambulatory Visit: Payer: No Typology Code available for payment source | Admitting: Sports Medicine

## 2019-06-02 ENCOUNTER — Other Ambulatory Visit: Payer: Self-pay

## 2019-06-02 ENCOUNTER — Encounter: Payer: Self-pay | Admitting: Sports Medicine

## 2019-06-02 VITALS — BP 128/78 | Ht 68.0 in | Wt 170.0 lb

## 2019-06-02 DIAGNOSIS — M216X9 Other acquired deformities of unspecified foot: Secondary | ICD-10-CM | POA: Diagnosis not present

## 2019-06-02 DIAGNOSIS — M79672 Pain in left foot: Secondary | ICD-10-CM | POA: Insufficient documentation

## 2019-06-02 NOTE — Progress Notes (Signed)
Neil Cox is a 53 y.o. male who presents to [facility] today for the following:  Left Foot Pain Worsening pain in last 2 months Has a history of morton's neuromas bilaterally  Thinks that pain is concentrated at third metatarsal head, concerned that it has dropped No radiation of pain Walking makes it worse, nothing else  Has been icing and using TENS but hasn't improved much When bad he has been taking Aleve 2 tabs Worsens after being on feet for a long time Also noting that he has been having more foot pain on right as well Thinks he needs new orthotics, about 47.23-76 years old Took out left neuroma pad from orthotics about 1.5 weeks ago because he felt like it was hurting more Has been feeling a little better without neuroma pad Last week or so feels like he can't walk 2/2 pain  PMH reviewed.  ROS as above. Medications reviewed.  Exam:  BP 128/78   Ht 5\' 8"  (1.727 m)   Wt 170 lb (77.1 kg)   BMI 25.85 kg/m  Gen: Well NAD  Left Foot: Inspection: Prominence over head of third metatarsal on left foot compared to right.  No obvious erythema or bruising over the area.  Patient has collapse of his transverse arch bilaterally.  Walks with antalgic gait favoring left.  Breakdown of left orthotic noted at area of pressure from third metatarsal head. Palpation: Tenderness to palpation of plantar aspect of third metatarsal, no tenderness palpation of dorsal aspect of third metatarsal, no tenderness palpation of second metatarsal or fourth metatarsal specifically. ROM: Full  ROM of the ankle. Normal midfoot flexibility Strength: 5/5 strength ankle in all planes Neurovascular: N/V intact distally in the lower extremity Special tests: Patient unable to perform heel raise given significant pain with pressure on left third metatarsal   Patient was fitted for a : standard, cushioned, semi-rigid orthotic. The orthotic was heated and afterward the patient stood on the orthotic blank positioned on  the orthotic stand. The patient was positioned in subtalar neutral position and 10 degrees of ankle dorsiflexion in a weight bearing stance. After completion of molding, a stable base was applied to the orthotic blank. The blank was ground to a stable position for weight bearing. Size: 10 Base: blue EVA Posting: Fifth ray on left Additional orthotic padding: Metatarsal pads bilaterally, neuroma pad on right  Total time spent with the patient was 30 minutes with greater than 50% of the time spent in face-to-face consultation discussing orthotic construction, instruction, and sitting. Gait was neutral with orthotics in place. Patient found them to be comfortable. Follow-up as needed.   No results found.   Assessment and Plan: 1) Left foot pain No evidence of stress fracture given patient does not have pain on the dorsum of his foot with palpation.  Patient seen likely secondary to need for increased support of transverse arch on left.  He is also having some pain on right, therefore we will fit patient for new custom orthotics to support transverse arch bilaterally.  Patient also has a history of Morton's neuroma is, therefore will sit with neuroma pad on right only, as left was causing pain.  Patient ambulated in new orthotics prior to leaving the office and had improvement in his pain of his left foot.  Follow-up as needed.  Advised can use ibuprofen or Aleve as needed for pain.   [name], D.O.  PGY-2 Family Medicine  06/02/2019 11:47 AM   Patient seen and evaluated with  the resident.  I agree with the above plan of care.  Custom orthotics were created today (see above).  As long as this solves his metatarsalgia, Neil Cox can follow-up with me as needed.

## 2019-06-02 NOTE — Assessment & Plan Note (Addendum)
No evidence of stress fracture given patient does not have pain on the dorsum of his foot with palpation.  Patient seen likely secondary to need for increased support of transverse arch on left.  He is also having some pain on right, therefore we will fit patient for new custom orthotics to support transverse arch bilaterally.  Patient also has a history of Morton's neuroma is, therefore will sit with neuroma pad on right only, as left was causing pain.  Patient ambulated in new orthotics prior to leaving the office and had improvement in his pain of his left foot.  Follow-up as needed.  Advised can use ibuprofen or Aleve as needed for pain.

## 2019-06-16 ENCOUNTER — Telehealth: Payer: Self-pay

## 2019-06-16 DIAGNOSIS — M79671 Pain in right foot: Secondary | ICD-10-CM

## 2019-06-16 NOTE — Telephone Encounter (Signed)
Copied from [place] [phone]. Topic: Referral - Request for Referral >> Jun 16, 2019  4:05 PM Berneta Levins wrote: Has patient seen PCP for this complaint? yes *If NO, is insurance requiring patient see PCP for this issue before PCP can refer them? Referral for which specialty: foot specialist Preferred provider/office: Dr. Georgena Spurling, [facility] Reason for referral: bilateral foot pain/morton's neuroma.    Pt can be reached at [phone]

## 2019-06-17 NOTE — Telephone Encounter (Signed)
Called pt and informed him that Dr. Elease Hashimoto was not in the office today. Pt understood.

## 2019-06-18 NOTE — Telephone Encounter (Signed)
OK to refer as requested

## 2019-06-20 NOTE — Telephone Encounter (Signed)
Referral placed. Attempted to reach pt, no answer. Left vm per DPR that referral was placed.

## 2019-08-03 ENCOUNTER — Ambulatory Visit: Payer: No Typology Code available for payment source | Attending: Internal Medicine

## 2019-08-03 DIAGNOSIS — Z20822 Contact with and (suspected) exposure to covid-19: Secondary | ICD-10-CM

## 2019-08-05 LAB — NOVEL CORONAVIRUS, NAA: SARS-CoV-2, NAA: NOT DETECTED

## 2020-01-02 ENCOUNTER — Ambulatory Visit (INDEPENDENT_AMBULATORY_CARE_PROVIDER_SITE_OTHER): Payer: No Typology Code available for payment source

## 2020-01-02 ENCOUNTER — Encounter: Payer: Self-pay | Admitting: Family Medicine

## 2020-01-02 ENCOUNTER — Ambulatory Visit (INDEPENDENT_AMBULATORY_CARE_PROVIDER_SITE_OTHER): Payer: No Typology Code available for payment source | Admitting: Family Medicine

## 2020-01-02 ENCOUNTER — Other Ambulatory Visit: Payer: No Typology Code available for payment source

## 2020-01-02 VITALS — BP 136/78 | HR 74 | Temp 98.2°F | Ht 66.0 in | Wt 177.4 lb

## 2020-01-02 DIAGNOSIS — Z0000 Encounter for general adult medical examination without abnormal findings: Secondary | ICD-10-CM

## 2020-01-02 DIAGNOSIS — Z85828 Personal history of other malignant neoplasm of skin: Secondary | ICD-10-CM | POA: Diagnosis not present

## 2020-01-02 DIAGNOSIS — M79621 Pain in right upper arm: Secondary | ICD-10-CM | POA: Diagnosis not present

## 2020-01-02 LAB — CBC WITH DIFFERENTIAL/PLATELET
Basophils Absolute: 0 10*3/uL (ref 0.0–0.1)
Basophils Relative: 0.5 % (ref 0.0–3.0)
Eosinophils Absolute: 0 10*3/uL (ref 0.0–0.7)
Eosinophils Relative: 0.4 % (ref 0.0–5.0)
HCT: 41.6 % (ref 39.0–52.0)
Hemoglobin: 14.3 g/dL (ref 13.0–17.0)
Lymphocytes Relative: 38.9 % (ref 12.0–46.0)
Lymphs Abs: 1.9 10*3/uL (ref 0.7–4.0)
MCHC: 34.5 g/dL (ref 30.0–36.0)
MCV: 87.7 fl (ref 78.0–100.0)
Monocytes Absolute: 0.3 10*3/uL (ref 0.1–1.0)
Monocytes Relative: 6.8 % (ref 3.0–12.0)
Neutro Abs: 2.6 10*3/uL (ref 1.4–7.7)
Neutrophils Relative %: 53.4 % (ref 43.0–77.0)
Platelets: 184 10*3/uL (ref 150.0–400.0)
RBC: 4.74 Mil/uL (ref 4.22–5.81)
RDW: 13.7 % (ref 11.5–15.5)
WBC: 4.8 10*3/uL (ref 4.0–10.5)

## 2020-01-02 LAB — HEPATIC FUNCTION PANEL
ALT: 14 U/L (ref 0–53)
AST: 18 U/L (ref 0–37)
Albumin: 4.5 g/dL (ref 3.5–5.2)
Alkaline Phosphatase: 38 U/L — ABNORMAL LOW (ref 39–117)
Bilirubin, Direct: 0.2 mg/dL (ref 0.0–0.3)
Total Bilirubin: 0.9 mg/dL (ref 0.2–1.2)
Total Protein: 6.7 g/dL (ref 6.0–8.3)

## 2020-01-02 LAB — LIPID PANEL
Cholesterol: 190 mg/dL (ref 0–200)
HDL: 68.3 mg/dL (ref >39.00)
LDL Cholesterol: 108 mg/dL — ABNORMAL HIGH (ref 0–99)
NonHDL: 121.66
Total CHOL/HDL Ratio: 3
Triglycerides: 69 mg/dL (ref 0.0–149.0)
VLDL: 13.8 mg/dL (ref 0.0–40.0)

## 2020-01-02 LAB — BASIC METABOLIC PANEL
BUN: 15 mg/dL (ref 6–23)
CO2: 29 mEq/L (ref 19–32)
Calcium: 9.5 mg/dL (ref 8.4–10.5)
Chloride: 102 mEq/L (ref 96–112)
Creatinine, Ser: 0.95 mg/dL (ref 0.40–1.50)
GFR: 82.94 mL/min (ref >60.00)
Glucose, Bld: 112 mg/dL — ABNORMAL HIGH (ref 70–99)
Potassium: 4.5 mEq/L (ref 3.5–5.1)
Sodium: 138 mEq/L (ref 135–145)

## 2020-01-02 LAB — TSH: TSH: 0.65 u[IU]/mL (ref 0.35–4.50)

## 2020-01-02 LAB — PSA: PSA: 0.29 ng/mL (ref 0.10–4.00)

## 2020-01-02 NOTE — Progress Notes (Signed)
Subjective:     Patient ID: Neil Cox, male   DOB: 1965/09/03, 54 y.o.   MRN: [id number]  HPI Neil Cox is seen for physical exam.  Generally very healthy.  He has squamous cell carcinoma right thumb back in 2001.  This is followed at [facility].  He had chest x-rays for several years and was then released.  He is recently had some right axillary pain for several months but this has not been associated with any adenopathy.  No night sweats.  No appetite or weight changes.  No pleuritic pain.  No cough.  Pain is not easily reproducible.  This has not limited his exercise.  He also has about a 82-month history of some left posterior calf pain.  This was noted after doing some gardening.  He is not seeing swelling.  Takes no regular medications.  Immunizations up-to-date.  Colonoscopy up-to-date.  Past Medical History:  Diagnosis Date  . Allergy   . Arthritis   . Cancer ([place]) 2001   right thumb cancer/squamous cell  . DDD (degenerative disc disease), lumbosacral    L3-L4  . GERD (gastroesophageal reflux disease)   . Hypertension    in past   Past Surgical History:  Procedure Laterality Date  . EXCISION MORTON'S NEUROMA     30 years ago  . thumb surg     had cancer right thumb  . UPPER GASTROINTESTINAL ENDOSCOPY     occasional GERD/no meds    reports that he has never smoked. He quit smokeless tobacco use about 20 years ago.  His smokeless tobacco use included chew. He reports that he does not drink alcohol or use drugs. family history includes Arthritis in his father and paternal grandmother; Diabetes in his brother and father; Hypertension in his paternal grandmother; Osteoarthritis in his mother. No Known Allergies]   Review of Systems  Constitutional: Negative for activity change, appetite change, fatigue, fever and unexpected weight change.  HENT: Negative for congestion, ear pain, sore throat and trouble swallowing.   Eyes: Negative for pain and visual disturbance.  Respiratory:  Negative for cough, shortness of breath and wheezing.   Cardiovascular: Negative for chest pain and palpitations.  Gastrointestinal: Negative for abdominal distention, abdominal pain, blood in stool, constipation, diarrhea, nausea, rectal pain and vomiting.  Endocrine: Negative for polydipsia and polyuria.  Genitourinary: Negative for dysuria, hematuria and testicular pain.  Musculoskeletal: Negative for arthralgias and joint swelling.  Skin: Negative for rash.  Neurological: Negative for dizziness, syncope and headaches.  Hematological: Negative for adenopathy.  Psychiatric/Behavioral: Negative for confusion and dysphoric mood.       Objective:   Physical Exam Constitutional:      General: He is not in acute distress.    Appearance: He is well-developed.  HENT:     Head: Normocephalic and atraumatic.     Right Ear: External ear normal.     Left Ear: External ear normal.  Eyes:     Conjunctiva/sclera: Conjunctivae normal.     Pupils: Pupils are equal, round, and reactive to light.  Neck:     Thyroid: No thyromegaly.  Cardiovascular:     Rate and Rhythm: Normal rate and regular rhythm.     Heart sounds: Normal heart sounds. No murmur.  Pulmonary:     Effort: No respiratory distress.     Breath sounds: No wheezing or rales.     Comments: No axillary adenopathy Abdominal:     General: Bowel sounds are normal. There is no distension.  Palpations: Abdomen is soft. There is no mass.     Tenderness: There is no abdominal tenderness. There is no guarding or rebound.  Musculoskeletal:     Cervical back: Normal range of motion and neck supple.     Right lower leg: No edema.     Left lower leg: No edema.  Lymphadenopathy:     Cervical: No cervical adenopathy.  Skin:    Findings: No rash.  Neurological:     Mental Status: He is alert and oriented to person, place, and time.     Cranial Nerves: No cranial nerve deficit.     Deep Tendon Reflexes: Reflexes normal.         Assessment:     Physical exam.  Generally healthy 54 year old male.  Remote history of squamous cell carcinoma right thumb.  He gets regular dermatology checks.  Had some recent nondescript pain right axillary region with no evidence for any adenopathy or other mass in the right axillary region    Plan:     -Obtain labs including PSA -Obtain PA and lateral chest x-ray -Continue regular exercise habits -Colonoscopy up-to-date as above -We fail to discuss shingles vaccine today and this will need to be reviewed on future visit.  Eulas Post MD [facility]

## 2020-01-04 ENCOUNTER — Telehealth: Payer: Self-pay | Admitting: Family Medicine

## 2020-01-04 DIAGNOSIS — Q159 Congenital malformation of eye, unspecified: Secondary | ICD-10-CM

## 2020-01-04 NOTE — Addendum Note (Signed)
Addended by: Modena Morrow R on: 01/04/2020 04:55 PM   Modules accepted: Orders

## 2020-01-04 NOTE — Telephone Encounter (Signed)
Please advise okay to place ?

## 2020-01-04 NOTE — Telephone Encounter (Signed)
Pt is needing a referral per his insurance([employer]) to [facility] Dr. Luretha Rued.  Pt stated that he is having issues with his eyes and the facility is working him in.

## 2020-01-04 NOTE — Telephone Encounter (Signed)
Okay to refer, as requested

## 2020-01-04 NOTE — Telephone Encounter (Signed)
Referral has been placed. 

## 2020-06-18 ENCOUNTER — Telehealth: Payer: Self-pay | Admitting: Family Medicine

## 2020-06-18 DIAGNOSIS — Z1283 Encounter for screening for malignant neoplasm of skin: Secondary | ICD-10-CM

## 2020-06-18 NOTE — Telephone Encounter (Signed)
Pt is calling in needing a referral to the dermatologist Camillo Flaming, MD) for his annual skin screening.

## 2020-06-19 NOTE — Telephone Encounter (Signed)
Okay for referral?

## 2020-06-19 NOTE — Telephone Encounter (Signed)
Referral entered  

## 2020-06-19 NOTE — Telephone Encounter (Signed)
ok 

## 2020-08-15 ENCOUNTER — Other Ambulatory Visit (HOSPITAL_COMMUNITY): Payer: Self-pay | Admitting: General Surgery

## 2020-08-29 ENCOUNTER — Telehealth: Payer: Self-pay | Admitting: Family Medicine

## 2020-08-29 DIAGNOSIS — Z85828 Personal history of other malignant neoplasm of skin: Secondary | ICD-10-CM

## 2020-08-29 NOTE — Telephone Encounter (Signed)
Pt call and want a referral to the dermatology skin center want to see dr.Mitkov.

## 2020-08-30 NOTE — Telephone Encounter (Signed)
Referral placed.

## 2020-08-30 NOTE — Telephone Encounter (Signed)
OK to refer to Dr Winifred Olive with the [facility]

## 2020-09-03 ENCOUNTER — Telehealth: Payer: Self-pay | Admitting: Family Medicine

## 2020-09-03 NOTE — Telephone Encounter (Signed)
The patient would like to have the referral for the Dermatologist Karin Golden at the [place] location not the [place] location.  [address] [place], [place] [address]  Phone: [phone]  Fax: [phone]

## 2020-09-10 NOTE — Telephone Encounter (Signed)
Referral place on [facility] / and sent to skin surgery center  ( that provider is with that office )- Their office will contact the pt to schedule directly  [place], [facility] Dermatologist  [address] [place], [place] [address]   [phone]

## 2020-10-08 ENCOUNTER — Telehealth (INDEPENDENT_AMBULATORY_CARE_PROVIDER_SITE_OTHER): Payer: No Typology Code available for payment source | Admitting: Family Medicine

## 2020-10-08 DIAGNOSIS — G479 Sleep disorder, unspecified: Secondary | ICD-10-CM | POA: Diagnosis not present

## 2020-10-08 NOTE — Progress Notes (Signed)
Patient ID: Neil Cox, male   DOB: Dec 18, 1965, 55 y.o.   MRN: [id number]  This visit type was conducted due to national recommendations for restrictions regarding the COVID-19 pandemic in an effort to limit this patient's exposure and mitigate transmission in our community.   Virtual Visit via Video Note  I connected with Neil Cox on 10/08/20 at 10:15 AM EST by a video enabled telemedicine application and verified that I am speaking with the correct person using two identifiers.  Location patient: home Location provider:work or home office Persons participating in the virtual visit: patient, provider  I discussed the limitations of evaluation and management by telemedicine and the availability of in person appointments. The patient expressed understanding and agreed to proceed.   HPI:  Neil Cox called with concerns for possible obstructive sleep apnea.  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  He notices that he gets very fatigued about 7 PM at night.  He had some chronic insomnia.  He usually goes to bed around 11 PM and frequently wakes up around 4 AM and unable to get back to sleep.  His wife has noted that he snores loudly and has periods of several seconds where he is silent followed by gasping.  He states he has a brother and father who have history of obstructive sleep apnea.  Generally only getting about 4 or 5 hours of sleep per night.  He knows this is not optimal.  Denies any depression symptoms at this time  ROS: See pertinent positives and negatives per HPI.  Past Medical History:  Diagnosis Date  . Allergy   . Arthritis   . Cancer ([place]) 2001   right thumb cancer/squamous cell  . DDD (degenerative disc disease), lumbosacral    L3-L4  . GERD (gastroesophageal reflux disease)   . Hypertension    in past    Past Surgical History:  Procedure Laterality Date  . EXCISION MORTON'S NEUROMA     30 years  ago  . thumb surg     had cancer right thumb  . UPPER GASTROINTESTINAL ENDOSCOPY     occasional GERD/no meds    Family History  Problem Relation Age of Onset  . Arthritis Father   . Diabetes Father   . Osteoarthritis Mother   . Arthritis Paternal Grandmother   . Hypertension Paternal Grandmother   . Diabetes Brother   . Colon cancer Neg Hx   . Stomach cancer Neg Hx   . Rectal cancer Neg Hx     SOCIAL HX: Non-smoker   Current Outpatient Medications:  .  Ascorbic Acid (VITA-C PO), Take 500 mg by mouth daily., Disp: , Rfl:  .  cholecalciferol (VITAMIN D) 1000 units tablet, Take 2,000 Units by mouth daily., Disp: , Rfl:  .  Multiple Vitamin (ONE-A-DAY MENS PO), Take by mouth daily., Disp: , Rfl:  .  naproxen sodium (ALEVE) 220 MG tablet, Take 220 mg by mouth as needed., Disp: , Rfl:   Current Facility-Administered Medications:  .  0.9 %  sodium chloride infusion, 500 mL, Intravenous, Once, Pyrtle, Lajuan Lines, MD  EXAM:  VITALS per patient if applicable:  GENERAL: alert, oriented, appears well and in no acute distress  HEENT: atraumatic, conjunttiva clear, no obvious abnormalities on inspection of external nose and ears  NECK: normal movements of the head and neck  LUNGS: on inspection no signs of respiratory distress, breathing rate appears normal, no obvious  gross SOB, gasping or wheezing  CV: no obvious cyanosis  MS: moves all visible extremities without noticeable abnormality  PSYCH/NEURO: pleasant and cooperative, no obvious depression or anxiety, speech and thought processing grossly intact  ASSESSMENT AND PLAN:  Discussed the following assessment and plan:  Disordered sleep.  Rule out obstructive sleep apnea  -Set up referral to pulmonary for obstructive sleep apnea work-up     I discussed the assessment and treatment plan with the patient. The patient was provided an opportunity to ask questions and all were answered. The patient agreed with the plan and  demonstrated an understanding of the instructions.   The patient was advised to call back or seek an in-person evaluation if the symptoms worsen or if the condition fails to improve as anticipated.     Carolann Littler, MD

## 2020-10-15 ENCOUNTER — Encounter: Payer: Self-pay | Admitting: Family Medicine

## 2020-10-31 ENCOUNTER — Other Ambulatory Visit: Payer: Self-pay | Admitting: Family Medicine

## 2020-10-31 ENCOUNTER — Encounter: Payer: Self-pay | Admitting: Family Medicine

## 2020-10-31 ENCOUNTER — Ambulatory Visit (INDEPENDENT_AMBULATORY_CARE_PROVIDER_SITE_OTHER): Payer: No Typology Code available for payment source | Admitting: Family Medicine

## 2020-10-31 ENCOUNTER — Other Ambulatory Visit: Payer: Self-pay

## 2020-10-31 VITALS — BP 132/80 | HR 80 | Temp 98.1°F | Ht 66.0 in | Wt 169.1 lb

## 2020-10-31 DIAGNOSIS — M5416 Radiculopathy, lumbar region: Secondary | ICD-10-CM | POA: Diagnosis not present

## 2020-10-31 MED ORDER — PREDNISONE 20 MG PO TABS
ORAL_TABLET | ORAL | 0 refills | Status: DC
Start: 2020-10-31 — End: 2020-10-31

## 2020-10-31 MED ORDER — CYCLOBENZAPRINE HCL 10 MG PO TABS
10.0000 mg | ORAL_TABLET | Freq: Three times a day (TID) | ORAL | 0 refills | Status: DC | PRN
Start: 2020-10-31 — End: 2020-10-31

## 2020-10-31 NOTE — Patient Instructions (Signed)
Radicular Pain Radicular pain is a type of pain that spreads from your back or neck along a spinal nerve. Spinal nerves are nerves that leave the spinal cord and go to the muscles. Radicular pain is sometimes called radiculopathy, radiculitis, or a pinched nerve. When you have this type of pain, you may also have weakness, numbness, or tingling in the area of your body that is supplied by the nerve. The pain may feel sharp and burning. Depending on which spinal nerve is affected, the pain may occur in the:  Neck area (cervical radicular pain). You may also feel pain, numbness, weakness, or tingling in the arms.  Mid-spine area (thoracic radicular pain). You would feel this pain in the back and chest. This type is rare.  Lower back area (lumbar radicular pain). You would feel this pain as low back pain. You may feel pain, numbness, weakness, or tingling in the buttocks or legs. Sciatica is a type of lumbar radicular pain that shoots down the back of the leg. Radicular pain occurs when one of the spinal nerves becomes irritated or squeezed (compressed). It is often caused by something pushing on a spinal nerve, such as one of the bones of the spine (vertebrae) or one of the round cushions between vertebrae (intervertebral disks). This can result from:  An injury.  Wear and tear or aging of a disk.  The growth of a bone spur that pushes on the nerve. Radicular pain often goes away when you follow instructions from your health care provider for relieving pain at home. Follow these instructions at home: Managing pain  If directed, put ice on the affected area: ? Put ice in a plastic bag. ? Place a towel between your skin and the bag. ? Leave the ice on for 20 minutes, 2-3 times a day.  If directed, apply heat to the affected area as often as told by your health care provider. Use the heat source that your health care provider recommends, such as a moist heat pack or a heating pad. ? Place a towel  between your skin and the heat source. ? Leave the heat on for 20-30 minutes. ? Remove the heat if your skin turns bright red. This is especially important if you are unable to feel pain, heat, or cold. You may have a greater risk of getting burned.      Activity  Do not sit or rest in bed for long periods of time.  Try to stay as active as possible. Ask your health care provider what type of exercise or activity is best for you.  Avoid activities that make your pain worse, such as bending and lifting.  Do not lift anything that is heavier than 10 lb (4.5 kg), or the limit that you are told, until your health care provider says that it is safe.  Practice using proper technique when lifting items. Proper lifting technique involves bending your knees and rising up.  Do strength and range-of-motion exercises only as told by your health care provider or physical therapist.   General instructions  Take over-the-counter and prescription medicines only as told by your health care provider.  Pay attention to any changes in your symptoms.  Keep all follow-up visits as told by your health care provider. This is important. ? Your health care provider may send you to a physical therapist to help with this pain. Contact a health care provider if:  Your pain and other symptoms get worse.  Your pain medicine   is not helping.  Your pain has not improved after a few weeks of home care.  You have a fever. Get help right away if:  You have severe pain, weakness, or numbness.  You have difficulty with bladder or bowel control. Summary  Radicular pain is a type of pain that spreads from your back or neck along a spinal nerve.  When you have radicular pain, you may also have weakness, numbness, or tingling in the area of your body that is supplied by the nerve.  The pain may feel sharp or burning.  Radicular pain may be treated with ice, heat, medicines, or physical therapy. This  information is not intended to replace advice given to you by your health care provider. Make sure you discuss any questions you have with your health care provider. Document Revised: 02/09/2018 Document Reviewed: 02/09/2018 Elsevier Patient Education  2021 [employer]  

## 2020-10-31 NOTE — Progress Notes (Signed)
Established Patient Office Visit  Subjective:  Patient ID: Neil Cox, male    DOB: 06/24/1966  Age: 56 y.o. MRN: [id number]  CC:  Chief Complaint  Patient presents with  . Back Pain    Disk that is aggravated - picked up a cooler - pain is more aggravating now.     HPI Neil Cox presents for low back pain which started 2 weeks ago after lifting a cooler.  He has had disc issues in the past that L4-5.  He has had some left lower extremity paresthesias since then.  No weakness.  He has responded favorably with Flexeril and prednisone in the past.  He does feel like he is having some muscle spasms.  No urine or stool incontinence.  He has planned trip in 3 weeks to [place] and hopes to be better before then.  He works as a Community education officer and has tried some stretches and has not seen much improvement with that.  Past Medical History:  Diagnosis Date  . Allergy   . Arthritis   . Cancer ([place]) 2001   right thumb cancer/squamous cell  . DDD (degenerative disc disease), lumbosacral    L3-L4  . GERD (gastroesophageal reflux disease)   . Hypertension    in past    Past Surgical History:  Procedure Laterality Date  . EXCISION MORTON'S NEUROMA     30 years ago  . thumb surg     had cancer right thumb  . UPPER GASTROINTESTINAL ENDOSCOPY     occasional GERD/no meds    Family History  Problem Relation Age of Onset  . Arthritis Father   . Diabetes Father   . Osteoarthritis Mother   . Arthritis Paternal Grandmother   . Hypertension Paternal Grandmother   . Diabetes Brother   . Colon cancer Neg Hx   . Stomach cancer Neg Hx   . Rectal cancer Neg Hx     Social History   Socioeconomic History  . Marital status: Married    Spouse name: Not on file  . Number of children: Not on file  . Years of education: Not on file  . Highest education level: Not on file  Occupational History  . Not on file  Tobacco Use  . Smoking status: Never Smoker  . Smokeless  tobacco: Former Systems developer    Types: Secondary school teacher  . Vaping Use: Never used  Substance and Sexual Activity  . Alcohol use: No  . Drug use: No  . Sexual activity: Not on file  Other Topics Concern  . Not on file  Social History Narrative  . Not on file   Social Determinants of Health   Financial Resource Strain: Not on file  Food Insecurity: Not on file  Transportation Needs: Not on file  Physical Activity: Not on file  Stress: Not on file  Social Connections: Not on file  Intimate Partner Violence: Not on file    Outpatient Medications Prior to Visit  Medication Sig Dispense Refill  . Ascorbic Acid (VITA-C PO) Take 500 mg by mouth daily.    . cholecalciferol (VITAMIN D) 1000 units tablet Take 2,000 Units by mouth daily.    . Multiple Vitamin (ONE-A-DAY MENS PO) Take by mouth daily.    . naproxen sodium (ALEVE) 220 MG tablet Take 220 mg by mouth as needed.     Facility-Administered Medications Prior to Visit  Medication Dose Route Frequency Provider Last Rate Last Admin  . 0.9 %  sodium chloride infusion  500 mL Intravenous Once Pyrtle, Lajuan Lines, MD        No Known Allergies  ROS Review of Systems  Constitutional: Negative for chills and fever.  Cardiovascular: Negative for chest pain.  Gastrointestinal: Negative for abdominal pain.  Genitourinary: Negative for dysuria.  Musculoskeletal: Positive for back pain.  Neurological: Negative for weakness.      Objective:    Physical Exam Vitals reviewed.  Constitutional:      Appearance: Normal appearance.  Cardiovascular:     Rate and Rhythm: Normal rate and regular rhythm.  Musculoskeletal:     Comments: Straight leg raises are negative bilaterally  Neurological:     Mental Status: He is alert.     Comments: 2+ reflexes knee and ankle bilaterally He has full strength with plantarflexion and dorsiflexion on the left     BP 132/80   Pulse 80   Temp 98.1 F (36.7 C) (Oral)   Ht 5\' 6"  (1.676 m)   Wt 169 lb 1 oz  (76.7 kg)   SpO2 99%   BMI 27.29 kg/m  Wt Readings from Last 3 Encounters:  10/31/20 169 lb 1 oz (76.7 kg)  01/02/20 177 lb 6.4 oz (80.5 kg)  06/02/19 170 lb (77.1 kg)     Health Maintenance Due  Topic Date Due  . Hepatitis C Screening  Never done  . HIV Screening  Never done  . COVID-19 Vaccine (3 - Pfizer risk 4-dose series) 10/15/2019  . INFLUENZA VACCINE  03/11/2020    There are no preventive care reminders to display for this patient.  Lab Results  Component Value Date   TSH 0.65 01/02/2020   Lab Results  Component Value Date   WBC 4.8 01/02/2020   HGB 14.3 01/02/2020   HCT 41.6 01/02/2020   MCV 87.7 01/02/2020   PLT 184.0 01/02/2020   Lab Results  Component Value Date   NA 138 01/02/2020   K 4.5 01/02/2020   CO2 29 01/02/2020   GLUCOSE 112 (H) 01/02/2020   BUN 15 01/02/2020   CREATININE 0.95 01/02/2020   BILITOT 0.9 01/02/2020   ALKPHOS 38 (L) 01/02/2020   AST 18 01/02/2020   ALT 14 01/02/2020   PROT 6.7 01/02/2020   ALBUMIN 4.5 01/02/2020   CALCIUM 9.5 01/02/2020   GFR 82.94 01/02/2020   Lab Results  Component Value Date   CHOL 190 01/02/2020   Lab Results  Component Value Date   HDL 68.30 01/02/2020   Lab Results  Component Value Date   LDLCALC 108 (H) 01/02/2020   Lab Results  Component Value Date   TRIG 69.0 01/02/2020   Lab Results  Component Value Date   CHOLHDL 3 01/02/2020   No results found for: HGBA1C    Assessment & Plan:   Acute left lumbar radiculitis.  Nonfocal neuro exam.  History of similar aggravations in the past.  -Flexeril 10 mg p.o. nightly -Prednisone taper which he seems to have benefited from in the past. -Follow-up for any persistent or worsening symptoms -He may consider having a colleague do dry needling if not improving with the above  Meds ordered this encounter  Medications  . cyclobenzaprine (FLEXERIL) 10 MG tablet    Sig: Take 1 tablet (10 mg total) by mouth 3 (three) times daily as needed for  muscle spasms.    Dispense:  30 tablet    Refill:  0  . predniSONE (DELTASONE) 20 MG tablet    Sig: Taper as follows:  3-3-3-2-2-2-1-1-1    Dispense:  18 tablet    Refill:  0    Follow-up: No follow-ups on file.    Carolann Littler, MD

## 2020-11-28 ENCOUNTER — Institutional Professional Consult (permissible substitution): Payer: No Typology Code available for payment source | Admitting: Pulmonary Disease

## 2020-12-19 ENCOUNTER — Encounter: Payer: Self-pay | Admitting: Pulmonary Disease

## 2020-12-19 ENCOUNTER — Ambulatory Visit: Payer: No Typology Code available for payment source | Admitting: Pulmonary Disease

## 2020-12-19 ENCOUNTER — Other Ambulatory Visit: Payer: Self-pay

## 2020-12-19 VITALS — BP 126/82 | HR 73 | Temp 98.1°F | Ht 68.5 in | Wt 166.6 lb

## 2020-12-19 DIAGNOSIS — R0683 Snoring: Secondary | ICD-10-CM | POA: Diagnosis not present

## 2020-12-19 NOTE — Patient Instructions (Signed)
We will schedule you for home sleep study  Update you with results as soon as reviewed Treatment options as discussed  Tentative follow-up in 3 to 4 months Sleep Apnea Sleep apnea affects breathing during sleep. It causes breathing to stop for a short time or to become shallow. It can also increase the risk of:  Heart attack.  Stroke.  Being very overweight (obese).  Diabetes.  Heart failure.  Irregular heartbeat. The goal of treatment is to help you breathe normally again. What are the causes? There are three kinds of sleep apnea:  Obstructive sleep apnea. This is caused by a blocked or collapsed airway.  Central sleep apnea. This happens when the brain does not send the right signals to the muscles that control breathing.  Mixed sleep apnea. This is a combination of obstructive and central sleep apnea. The most common cause of this condition is a collapsed or blocked airway. This can happen if:  Your throat muscles are too relaxed.  Your tongue and tonsils are too large.  You are overweight.  Your airway is too small.   What increases the risk?  Being overweight.  Smoking.  Having a small airway.  Being older.  Being male.  Drinking alcohol.  Taking medicines to calm yourself (sedatives or tranquilizers).  Having family members with the condition. What are the signs or symptoms?  Trouble staying asleep.  Being sleepy or tired during the day.  Getting angry a lot.  Loud snoring.  Headaches in the morning.  Not being able to focus your mind (concentrate).  Forgetting things.  Less interest in sex.  Mood swings.  Personality changes.  Feelings of sadness (depression).  Waking up a lot during the night to pee (urinate).  Dry mouth.  Sore throat. How is this diagnosed?  Your medical history.  A physical exam.  A test that is done when you are sleeping (sleep study). The test is most often done in a sleep lab but may also be done  at home. How is this treated?  Sleeping on your side.  Using a medicine to get rid of mucus in your nose (decongestant).  Avoiding the use of alcohol, medicines to help you relax, or certain pain medicines (narcotics).  Losing weight, if needed.  Changing your diet.  Not smoking.  Using a machine to open your airway while you sleep, such as: ? An oral appliance. This is a mouthpiece that shifts your lower jaw forward. ? A CPAP device. This device blows air through a mask when you breathe out (exhale). ? An EPAP device. This has valves that you put in each nostril. ? A BPAP device. This device blows air through a mask when you breathe in (inhale) and breathe out.  Having surgery if other treatments do not work. It is important to get treatment for sleep apnea. Without treatment, it can lead to:  High blood pressure.  Coronary artery disease.  In men, not being able to have an erection (impotence).  Reduced thinking ability.   Follow these instructions at home: Lifestyle  Make changes that your doctor recommends.  Eat a healthy diet.  Lose weight if needed.  Avoid alcohol, medicines to help you relax, and some pain medicines.  Do not use any products that contain nicotine or tobacco, such as cigarettes, e-cigarettes, and chewing tobacco. If you need help quitting, ask your doctor. General instructions  Take over-the-counter and prescription medicines only as told by your doctor.  If you were given  a machine to use while you sleep, use it only as told by your doctor.  If you are having surgery, make sure to tell your doctor you have sleep apnea. You may need to bring your device with you.  Keep all follow-up visits as told by your doctor. This is important. Contact a doctor if:  The machine that you were given to use during sleep bothers you or does not seem to be working.  You do not get better.  You get worse. Get help right away if:  Your chest  hurts.  You have trouble breathing in enough air.  You have an uncomfortable feeling in your back, arms, or stomach.  You have trouble talking.  One side of your body feels weak.  A part of your face is hanging down. These symptoms may be an emergency. Do not wait to see if the symptoms will go away. Get medical help right away. Call your local emergency services (911 in the [place] Do not drive yourself to the hospital. Summary  This condition affects breathing during sleep.  The most common cause is a collapsed or blocked airway.  The goal of treatment is to help you breathe normally while you sleep. This information is not intended to replace advice given to you by your health care provider. Make sure you discuss any questions you have with your health care provider. Document Revised: 05/14/2018 Document Reviewed: 03/23/2018 Elsevier Patient Education  [place].

## 2020-12-19 NOTE — Progress Notes (Signed)
Neil Cox    [id number]    1965/12/31  Primary Care Physician:Burchette, Alinda Sierras, MD  Referring Physician: Eulas Post, MD [place],  [place] [address]  Chief complaint:   Patient has been evaluated for nonrestorative sleep, multiple awakenings  HPI:  History of snoring History of witnessed apneas  Usually goes to bed between 10 and 11 PM 5 to 50 minutes to fall asleep 3-4 awakenings Final wake up time between 5 and 6 AM  Weight has been relatively stable  Dad has OSA, sibling has OSA  Admits to dryness of his mouth in the mornings No morning headaches No night sweats Memory is fine  Never smoker  No difficulty driving No difficulty maintaining his attention  Yawns a lot during the day  Outpatient Encounter Medications as of 12/19/2020  Medication Sig  . Ascorbic Acid (VITA-C PO) Take 500 mg by mouth daily.  . cholecalciferol (VITAMIN D) 1000 units tablet Take 2,000 Units by mouth daily.  . cyclobenzaprine (FLEXERIL) 10 MG tablet TAKE 1 TABLET (10 MG TOTAL) BY MOUTH 3 (THREE) TIMES DAILY AS NEEDED FOR MUSCLE SPASMS.  . fluorouracil (EFUDEX) 5 % cream APPLY 1 APPLICATION TO SCALP EXTERNALLY ONCE A DAY FOR 21 DAYS  . Multiple Vitamin (ONE-A-DAY MENS PO) Take by mouth daily.  . naproxen sodium (ALEVE) 220 MG tablet Take 220 mg by mouth as needed.  . [DISCONTINUED] predniSONE (DELTASONE) 20 MG tablet TAPER AS FOLLOWS: TAKE 3 TABLETS BY MOUTH DAILY FOR 3 DAYS, 2 TABLETS DAILY FOR 3 DAYS THEN 1 TABLET DAILY FOR 3 DAYS   Facility-Administered Encounter Medications as of 12/19/2020  Medication  . 0.9 %  sodium chloride infusion    Allergies as of 12/19/2020  . (No Known Allergies)    Past Medical History:  Diagnosis Date  . Allergy   . Arthritis   . Cancer ([facility]) 2001   right thumb cancer/squamous cell  . DDD (degenerative disc disease), lumbosacral    L3-L4  . GERD (gastroesophageal reflux disease)   . Hypertension     in past    Past Surgical History:  Procedure Laterality Date  . EXCISION MORTON'S NEUROMA     30 years ago  . thumb surg     had cancer right thumb  . UPPER GASTROINTESTINAL ENDOSCOPY     occasional GERD/no meds    Family History  Problem Relation Age of Onset  . Arthritis Father   . Diabetes Father   . Osteoarthritis Mother   . Arthritis Paternal Grandmother   . Hypertension Paternal Grandmother   . Diabetes Brother   . Colon cancer Neg Hx   . Stomach cancer Neg Hx   . Rectal cancer Neg Hx     Social History   Socioeconomic History  . Marital status: Married    Spouse name: Not on file  . Number of children: Not on file  . Years of education: Not on file  . Highest education level: Not on file  Occupational History  . Not on file  Tobacco Use  . Smoking status: Never Smoker  . Smokeless tobacco: Former Systems developer    Types: Secondary school teacher  . Vaping Use: Never used  Substance and Sexual Activity  . Alcohol use: No  . Drug use: No  . Sexual activity: Not on file  Other Topics Concern  . Not on file  Social History Narrative  . Not on file  Social Determinants of Health   Financial Resource Strain: Not on file  Food Insecurity: Not on file  Transportation Needs: Not on file  Physical Activity: Not on file  Stress: Not on file  Social Connections: Not on file  Intimate Partner Violence: Not on file    Review of Systems  Constitutional: Negative for fatigue.  Respiratory: Negative for shortness of breath.   Psychiatric/Behavioral: Positive for sleep disturbance.    Vitals:   12/19/20 1137  BP: 126/82  Pulse: 73  Temp: 98.1 F (36.7 C)  SpO2: 98%     Physical Exam Constitutional:      Appearance: He is obese.  HENT:     Nose: No congestion.     Mouth/Throat:     Mouth: Mucous membranes are moist.     Comments: Mallampati 2, crowded, macroglossia Eyes:     General:        Right eye: No discharge.     Extraocular Movements: Extraocular  movements intact.     Pupils: Pupils are equal, round, and reactive to light.  Cardiovascular:     Rate and Rhythm: Normal rate and regular rhythm.     Heart sounds: No murmur heard.   Pulmonary:     Effort: No respiratory distress.     Breath sounds: No stridor. No wheezing or rhonchi.  Musculoskeletal:     Cervical back: No rigidity.  Neurological:     Mental Status: He is alert.  Psychiatric:        Mood and Affect: Mood normal.    Results of the Epworth flowsheet 12/19/2020  Sitting and reading 2  Watching TV 3  Sitting, inactive in a public place (e.g. a theatre or a meeting) 1  As a passenger in a car for an hour without a break 1  Lying down to rest in the afternoon when circumstances permit 2  Sitting and talking to someone 0  Sitting quietly after a lunch without alcohol 0  In a car, while stopped for a few minutes in traffic 0  Total score 9   Assessment:  Excessive daytime sleepiness  Nonrestorative sleep  Multiple awakenings  Pathophysiology of sleep disordered breathing reviewed with the patient Treatment options for sleep disordered breathing discussed with the patient  Plan/Recommendations: We will schedule patient for home sleep study  Update with results as soon as reviewed  Tentative follow-up in 3 to 4 months  Encouraged to call with any significant concerns   Sherrilyn Rist MD [facility] 12/19/2020, 12:09 PM  CC: Eulas Post, MD

## 2021-01-09 ENCOUNTER — Other Ambulatory Visit (HOSPITAL_COMMUNITY): Payer: Self-pay

## 2021-01-09 MED ORDER — CARESTART COVID-19 HOME TEST VI KIT
PACK | 0 refills | Status: DC
  Filled 2021-01-09: qty 4, 4d supply, fill #0

## 2021-01-15 ENCOUNTER — Ambulatory Visit: Payer: No Typology Code available for payment source

## 2021-01-15 ENCOUNTER — Other Ambulatory Visit: Payer: Self-pay

## 2021-01-15 DIAGNOSIS — R0683 Snoring: Secondary | ICD-10-CM

## 2021-01-15 DIAGNOSIS — G4733 Obstructive sleep apnea (adult) (pediatric): Secondary | ICD-10-CM

## 2021-01-24 DIAGNOSIS — G4733 Obstructive sleep apnea (adult) (pediatric): Secondary | ICD-10-CM

## 2021-01-25 ENCOUNTER — Telehealth: Payer: Self-pay | Admitting: Pulmonary Disease

## 2021-01-25 DIAGNOSIS — G4733 Obstructive sleep apnea (adult) (pediatric): Secondary | ICD-10-CM

## 2021-01-25 NOTE — Telephone Encounter (Signed)
Call patient  Sleep study result  Date of study: 01/15/2021  Impression: Moderate obstructive sleep apnea Mild oxygen desaturations  Recommendation: DME referral  Recommend CPAP therapy for moderate obstructive sleep apnea  Auto titrating CPAP with pressure settings of 5-15 will be appropriate  Encourage weight loss measures  Follow-up in the office 4 to 6 weeks following initiation of treatment

## 2021-02-04 NOTE — Telephone Encounter (Signed)
ATC, left VM for callback. 

## 2021-02-05 NOTE — Telephone Encounter (Signed)
ATC, left VM for callback. Due to protocol, will send letter and close encounter. Nothing further needed.

## 2021-02-06 NOTE — Telephone Encounter (Signed)
Called and spoke with pt letting him know the results of the HST and that recommendations were for him to start on CPAP therapy. Pt verbalized understanding. Stated to pt that once he received his CPAP to call the office to schedule f/u appt in 4-6 weeks from the day he received his equipment. Pt has been made aware that it is taking longer than usual for DMEs to provide equipment due to recall of machines. Nothing further needed.

## 2021-02-06 NOTE — Addendum Note (Signed)
Addended by: Lorretta Harp on: 02/06/2021 09:56 AM   Modules accepted: Orders

## 2021-04-17 ENCOUNTER — Encounter: Payer: Self-pay | Admitting: Family Medicine

## 2021-04-24 ENCOUNTER — Ambulatory Visit (INDEPENDENT_AMBULATORY_CARE_PROVIDER_SITE_OTHER): Payer: No Typology Code available for payment source | Admitting: Family Medicine

## 2021-04-24 ENCOUNTER — Ambulatory Visit (HOSPITAL_BASED_OUTPATIENT_CLINIC_OR_DEPARTMENT_OTHER)
Admission: RE | Admit: 2021-04-24 | Discharge: 2021-04-24 | Disposition: A | Discharge status: Home / Self Care | Payer: No Typology Code available for payment source | Source: Ambulatory Visit | Attending: Family Medicine | Admitting: Family Medicine

## 2021-04-24 ENCOUNTER — Other Ambulatory Visit: Payer: Self-pay

## 2021-04-24 VITALS — BP 140/70 | HR 65 | Temp 97.8°F | Wt 165.2 lb

## 2021-04-24 DIAGNOSIS — M545 Low back pain, unspecified: Secondary | ICD-10-CM | POA: Diagnosis present

## 2021-04-24 NOTE — Progress Notes (Signed)
 Established Patient Office Visit  Subjective:  Patient ID: Neil Cox, male    DOB: 04/12/1966  Age: 55 y.o. MRN: [id number]  CC:  Chief Complaint  Patient presents with   Back Pain    Low back pain, reoccurring, discuss getting back injections    HPI Neil Cox presents for discussion regarding his chronic lumbar pain.  He has had years of low back pain and starting to impair day-to-day functions more more.  He had MRI way back in 2004 which showed bulging disc L3-4 and L4-5.  Tried multiple conservative therapies including medications and physical therapy exercises without any significant improvement.  He had sent a note recently specifically interested in PRP injections.  He found someone locally who does that.  He is requesting follow-up back films as he has not had these in several years.  He has pain which is the lower lumbar area and consistently left side.  Does have some left-sided radiculitis symptoms.  Occasional tingling left lower extremity.  No definite weakness.  No urine or stool incontinence.  Pain is worse with flexion.  He avoids any heavy lifting because of the pain Increasing sense of instability of low back over time.  Past Medical History:  Diagnosis Date   Allergy    Arthritis    Cancer ([facility]) 2001   right thumb cancer/squamous cell   DDD (degenerative disc disease), lumbosacral    L3-L4   GERD (gastroesophageal reflux disease)    Hypertension    in past    Past Surgical History:  Procedure Laterality Date   EXCISION MORTON'S NEUROMA     30 years ago   thumb surg     had cancer right thumb   UPPER GASTROINTESTINAL ENDOSCOPY     occasional GERD/no meds    Family History  Problem Relation Age of Onset   Arthritis Father    Diabetes Father    Osteoarthritis Mother    Arthritis Paternal Grandmother    Hypertension Paternal Grandmother    Diabetes Brother    Colon cancer Neg Hx    Stomach cancer Neg Hx    Rectal cancer Neg Hx     Social  History   Socioeconomic History   Marital status: Married    Spouse name: Not on file   Number of children: Not on file   Years of education: Not on file   Highest education level: Not on file  Occupational History   Not on file  Tobacco Use   Smoking status: Never   Smokeless tobacco: Former    Types: Chew    Quit date: 09/03/1999  Vaping Use   Vaping Use: Never used  Substance and Sexual Activity   Alcohol use: No   Drug use: No   Sexual activity: Not on file  Other Topics Concern   Not on file  Social History Narrative   Not on file   Social Determinants of Health   Financial Resource Strain: Not on file  Food Insecurity: Not on file  Transportation Needs: Not on file  Physical Activity: Not on file  Stress: Not on file  Social Connections: Not on file  Intimate Partner Violence: Not on file    Outpatient Medications Prior to Visit  Medication Sig Dispense Refill   Ascorbic Acid (VITA-C PO) Take 500 mg by mouth daily.     cholecalciferol (VITAMIN D) 1000 units tablet Take 2,000 Units by mouth daily.     COVID-19 At Home Antigen Test (CARESTART   COVID-19 HOME TEST) KIT Use as directd within package instructions 4 each 0   cyclobenzaprine (FLEXERIL) 10 MG tablet TAKE 1 TABLET (10 MG TOTAL) BY MOUTH 3 (THREE) TIMES DAILY AS NEEDED FOR MUSCLE SPASMS. 30 tablet 0   fluorouracil (EFUDEX) 5 % cream APPLY 1 APPLICATION TO SCALP EXTERNALLY ONCE A DAY FOR 21 DAYS 40 g 0   Multiple Vitamin (ONE-A-DAY MENS PO) Take by mouth daily.     naproxen sodium (ALEVE) 220 MG tablet Take 220 mg by mouth as needed.     Facility-Administered Medications Prior to Visit  Medication Dose Route Frequency Provider Last Rate Last Admin   0.9 %  sodium chloride infusion  500 mL Intravenous Once Pyrtle, Jay M, MD        No Known Allergies  ROS Review of Systems  Constitutional:  Negative for activity change, appetite change and fever.  Respiratory:  Negative for cough and shortness of  breath.   Cardiovascular:  Negative for chest pain and leg swelling.  Gastrointestinal:  Negative for abdominal pain and vomiting.  Genitourinary:  Negative for dysuria, flank pain and hematuria.  Musculoskeletal:  Positive for back pain. Negative for joint swelling.  Neurological:  Negative for weakness.     Objective:    Physical Exam Vitals reviewed.  Constitutional:      Appearance: Normal appearance.  Musculoskeletal:     Right lower leg: No edema.     Left lower leg: No edema.  Neurological:     Mental Status: He is alert.     Comments: 2+ reflexes knee and ankle bilaterally.  Full strength lower extremities.    BP 140/70 (BP Location: Left Arm, Patient Position: Sitting, Cuff Size: Normal)   Pulse 65   Temp 97.8 F (36.6 C) (Oral)   Wt 165 lb 3.2 oz (74.9 kg)   SpO2 99%   BMI 24.75 kg/m  Wt Readings from Last 3 Encounters:  04/24/21 165 lb 3.2 oz (74.9 kg)  12/19/20 166 lb 9.6 oz (75.6 kg)  10/31/20 169 lb 1 oz (76.7 kg)     Health Maintenance Due  Topic Date Due   Pneumococcal Vaccine 0-64 Years old (1 - PCV) Never done   HIV Screening  Never done   Hepatitis C Screening  Never done   Zoster Vaccines- Shingrix (1 of 2) Never done   COVID-19 Vaccine (3 - Pfizer risk series) 10/15/2019   INFLUENZA VACCINE  03/11/2021    There are no preventive care reminders to display for this patient.  Lab Results  Component Value Date   TSH 0.65 01/02/2020   Lab Results  Component Value Date   WBC 4.8 01/02/2020   HGB 14.3 01/02/2020   HCT 41.6 01/02/2020   MCV 87.7 01/02/2020   PLT 184.0 01/02/2020   Lab Results  Component Value Date   NA 138 01/02/2020   K 4.5 01/02/2020   CO2 29 01/02/2020   GLUCOSE 112 (H) 01/02/2020   BUN 15 01/02/2020   CREATININE 0.95 01/02/2020   BILITOT 0.9 01/02/2020   ALKPHOS 38 (L) 01/02/2020   AST 18 01/02/2020   ALT 14 01/02/2020   PROT 6.7 01/02/2020   ALBUMIN 4.5 01/02/2020   CALCIUM 9.5 01/02/2020   GFR 82.94  01/02/2020   Lab Results  Component Value Date   CHOL 190 01/02/2020   Lab Results  Component Value Date   HDL 68.30 01/02/2020   Lab Results  Component Value Date   LDLCALC 108 (H) 01/02/2020     Lab Results  Component Value Date   TRIG 69.0 01/02/2020   Lab Results  Component Value Date   CHOLHDL 3 01/02/2020   No results found for: HGBA1C    Assessment & Plan:   Problem List Items Addressed This Visit   None Visit Diagnoses     Lumbar pain    -  Primary   Relevant Orders   DG Lumbar Spine Complete     Chronic lumbar back pain.  History of disc bulging at a couple levels.  He is specifically interested in platelet rich plasma injections.  He is aware that his insurance may not cover this.  He does have some paperwork that he would like us to complete regarding this.  Patient also requesting follow-up lumbosacral films.  These will be ordered.  He has tried multiple conservative things as above without improvement and is trying to avoid surgery.  No orders of the defined types were placed in this encounter.   Follow-up: No follow-ups on file.    Bruce Burchette, MD 

## 2021-04-25 ENCOUNTER — Encounter: Payer: Self-pay | Admitting: Family Medicine

## 2021-04-30 ENCOUNTER — Encounter: Payer: Self-pay | Admitting: Family Medicine

## 2021-05-08 ENCOUNTER — Telehealth: Payer: Self-pay | Admitting: Family Medicine

## 2021-05-08 NOTE — Telephone Encounter (Signed)
PT called to see if the 05/14/2003 Xray done of his Lumbar Spine can be uploaded into mychart. If not can he get a copy of the results please.

## 2021-05-08 NOTE — Telephone Encounter (Signed)
ATC, unable to leave a voicemail.  Need to know if he is wanting the written report or the actual images. If he needs the actual images from this he may have to contact the imaging center.

## 2021-05-13 NOTE — Telephone Encounter (Signed)
ATC, unable to leave a voice mail.  

## 2021-05-14 NOTE — Telephone Encounter (Signed)
ATC, unable to leave a voice mail. Message will be closed.

## 2021-06-17 ENCOUNTER — Telehealth: Payer: Self-pay | Admitting: Pulmonary Disease

## 2021-06-17 DIAGNOSIS — G4733 Obstructive sleep apnea (adult) (pediatric): Secondary | ICD-10-CM

## 2021-06-18 NOTE — Telephone Encounter (Signed)
AO pt is requesting that the pressure be adjusted on his cpap.  He stated that he feels that the pressure is too much.  Please advise. thanks

## 2021-06-18 NOTE — Telephone Encounter (Signed)
Patient is on an auto titrated CPAP Pressure setting 5-20  We can send in a prescription 5-15  The machines usually give you the lowest pressure that the algorithm figures out you need

## 2021-06-18 NOTE — Telephone Encounter (Signed)
Called and spoke with patient to let him know that Dr. Ander Slade wants [place] to send in order to change his setting from 5-20 to 5-15. Advised him that it may take a couple days for them change it and if it is still much pressure once its changed then for him to call us back and let us know and we will see if he needs a titration study or something else. Patient expressed understanding. DME order sent. Nothing further needed at this time.

## 2021-07-02 ENCOUNTER — Telehealth: Payer: Self-pay | Admitting: Pulmonary Disease

## 2021-07-02 NOTE — Telephone Encounter (Signed)
LMTCB

## 2021-07-02 NOTE — Telephone Encounter (Signed)
Duplicate message.   Will close this encounter. Please see other phone note.

## 2021-07-02 NOTE — Telephone Encounter (Signed)
ATC LVMTCB x 1  

## 2021-07-08 NOTE — Telephone Encounter (Signed)
Called and spoke with patient. He stated that the 5-15cm setting on his cpap still seems to be too high. The machine tends to increase the pressure to 14cm in the middle of the night and he is not able to tolerate the pressure. He ends up not using the machine for the rest of the night.   He wants to know if he have the pressure lowered to 5-10cm.   AO, can you please advise? Thanks!

## 2021-07-10 NOTE — Telephone Encounter (Signed)
Okay to send in order to lower pressure to 5-10

## 2021-07-11 NOTE — Telephone Encounter (Signed)
Called and spoke to pt. Informed him of the recs per Dr. Ander Slade. New order placed for pressure change. Pt verbalized understanding and denied any further questions or concerns at this time.

## 2021-07-11 NOTE — Addendum Note (Signed)
Addended by: Collier Salina on: 07/11/2021 10:11 AM   Modules accepted: Orders

## 2021-08-21 ENCOUNTER — Encounter: Payer: Self-pay | Admitting: Primary Care

## 2021-08-21 ENCOUNTER — Ambulatory Visit: Payer: No Typology Code available for payment source | Admitting: Primary Care

## 2021-08-21 ENCOUNTER — Other Ambulatory Visit: Payer: Self-pay

## 2021-08-21 VITALS — BP 120/76 | HR 68 | Temp 97.9°F | Ht 67.0 in | Wt 170.2 lb

## 2021-08-21 DIAGNOSIS — G4733 Obstructive sleep apnea (adult) (pediatric): Secondary | ICD-10-CM | POA: Diagnosis not present

## 2021-08-21 NOTE — Progress Notes (Signed)
@Patient  ID: Neil Cox, male    DOB: 08-23-1965, 56 y.o.   MRN: [id number]  Chief Complaint  Patient presents with   Follow-up    Patient is here for cpap f/u. Patient has no complaints.    Referring provider: Eulas Post, MD  HPI: 56 year old male, never smoked.  Past medical history significant for moderate obstructive sleep apnea.  Patient of Dr. Ander Slade, seen for initial consult on 12/19/2020.  08/21/2021- Interim hx  Patient presents today for follow-up OSA. He received CPAP machine approx 3 months ago. He called our office in November asking that pressure setting be lowered. Current setting 5-10cm h20. He is able to fall asleep without issue, he wakes up feeling pressure is too strong and he can not fall back asleep so he takes his mask off. He is wearing full face mask size medium. DME company is [employer].   Airview download 07/21/21-08/19/21 Usage 9/30 days (30%); 3 days (10%) > 4 hours Average usage days used 2 hours 47 mins Pressure 5-10cm h20 (9.1cm h20-95%) Airleaks 2.4L/min (95%) AHI 1.2   No Known Allergies  Immunization History  Administered Date(s) Administered   Influenza Split 05/23/2013   Influenza,inj,Quad PF,6+ Mos 05/18/2014, 05/22/2015, 05/09/2016, 05/20/2018   PFIZER(Purple Top)SARS-COV-2 Vaccination 08/29/2019, 09/17/2019   Rabies, IM 03/06/2015, 03/09/2015, 03/13/2015, 03/24/2015   Td 08/12/2003   Tdap 05/08/2014    Past Medical History:  Diagnosis Date   Allergy    Arthritis    Cancer ([place]) 2001   right thumb cancer/squamous cell   DDD (degenerative disc disease), lumbosacral    L3-L4   GERD (gastroesophageal reflux disease)    Hypertension    in past    Tobacco History: Social History   Tobacco Use  Smoking Status Never  Smokeless Tobacco Former   Types: Chew   Quit date: 09/03/1999   Counseling given: Not Answered   Outpatient Medications Prior to Visit  Medication Sig Dispense Refill   Ascorbic Acid (VITA-C PO) Take  500 mg by mouth daily.     cholecalciferol (VITAMIN D) 1000 units tablet Take 2,000 Units by mouth daily.     COVID-19 At Home Antigen Test [facility] COVID-19 HOME TEST) KIT Use as directd within package instructions 4 each 0   Multiple Vitamin (ONE-A-DAY MENS PO) Take by mouth daily.     naproxen sodium (ALEVE) 220 MG tablet Take 220 mg by mouth as needed.     cyclobenzaprine (FLEXERIL) 10 MG tablet TAKE 1 TABLET (10 MG TOTAL) BY MOUTH 3 (THREE) TIMES DAILY AS NEEDED FOR MUSCLE SPASMS. (Patient not taking: Reported on 08/21/2021) 30 tablet 0   Facility-Administered Medications Prior to Visit  Medication Dose Route Frequency Provider Last Rate Last Admin   0.9 %  sodium chloride infusion  500 mL Intravenous Once Pyrtle, Lajuan Lines, MD        Review of Systems  Review of Systems  Constitutional: Negative.   HENT: Negative.    Respiratory: Negative.    Psychiatric/Behavioral:  Positive for sleep disturbance.     Physical Exam  BP 120/76 (BP Location: Right Arm, Patient Position: Sitting, Cuff Size: Normal)    Pulse 68    Temp 97.9 F (36.6 C) (Oral)    Ht 5' 7"  (1.702 m)    Wt 170 lb 3.2 oz (77.2 kg)    SpO2 98%    BMI 26.66 kg/m  Physical Exam Constitutional:      Appearance: Normal appearance.  HENT:     Head:  Normocephalic and atraumatic.     Mouth/Throat:     Comments: Mallampati class I Cardiovascular:     Rate and Rhythm: Normal rate and regular rhythm.  Pulmonary:     Effort: Pulmonary effort is normal.     Breath sounds: Normal breath sounds.  Skin:    General: Skin is warm and dry.  Neurological:     General: No focal deficit present.     Mental Status: He is alert and oriented to person, place, and time. Mental status is at baseline.  Psychiatric:        Mood and Affect: Mood normal.        Behavior: Behavior normal.        Thought Content: Thought content normal.        Judgment: Judgment normal.     Lab Results:  CBC    Component Value Date/Time   WBC 4.8  01/02/2020 1109   RBC 4.74 01/02/2020 1109   HGB 14.3 01/02/2020 1109   HCT 41.6 01/02/2020 1109   PLT 184.0 01/02/2020 1109   MCV 87.7 01/02/2020 1109   MCHC 34.5 01/02/2020 1109   RDW 13.7 01/02/2020 1109   LYMPHSABS 1.9 01/02/2020 1109   MONOABS 0.3 01/02/2020 1109   EOSABS 0.0 01/02/2020 1109   BASOSABS 0.0 01/02/2020 1109    BMET    Component Value Date/Time   NA 138 01/02/2020 1109   K 4.5 01/02/2020 1109   CL 102 01/02/2020 1109   CO2 29 01/02/2020 1109   GLUCOSE 112 (H) 01/02/2020 1109   BUN 15 01/02/2020 1109   CREATININE 0.95 01/02/2020 1109   CALCIUM 9.5 01/02/2020 1109   GFRNONAA 77.89 05/21/2009 0758    BNP No results found for: BNP  ProBNP No results found for: PROBNP  Imaging: No results found.   Assessment & Plan:   OSA (obstructive sleep apnea) - HST 01/15/21 showed moderate OSA, AHI 18.3/hr with SpO2 80%  - Airview download shows patient is 30% compliant with CPAP use. He is having difficulty with pressure setting, feels it is too strong. Current pressure setting 5-10cm h20 (9.1cm h20- 95%) with residual AHI 1.2.  - Recommend changing CPAP to set pressure 8cm h20.  If still having issues recommend CPAP titration study to determine best pressure setting  - Advised he aim to wear CPAP every night for 4-6 hours or more. Do not drive if appears excessive daytime fatigue or somnolence  Orders: Change CPAP pressure 8cm h20   Follow-up: 1 month virtual visit with Beth NP    Martyn Ehrich, NP 08/21/2021

## 2021-08-21 NOTE — Patient Instructions (Addendum)
Airview download shows that you are only 30% compliant with CPAP use Aim to wear CPAP every night for 4-6 hours or more We will adjust you CPAP pressure to 8cm h20. If still having issues we may have you go into sleep lab for CPAP titration study to determine best pressure setting  Do not drive if appears excessive daytime fatigue or somnolence  Orders: Change CPAP pressure 8cm h20   Follow-up: 1 month virtual visit with [facility] NP    Sleep Apnea Sleep apnea is a condition in which breathing pauses or becomes shallow during sleep. People with sleep apnea usually snore loudly. They may have times when they gasp and stop breathing for 10 seconds or more during sleep. This may happen many times during the night. Sleep apnea disrupts your sleep and keeps your body from getting the rest that it needs. This condition can increase your risk of certain health problems, including: Heart attack. Stroke. Obesity. Type 2 diabetes. Heart failure. Irregular heartbeat. High blood pressure. The goal of treatment is to help you breathe normally again. What are the causes? The most common cause of sleep apnea is a collapsed or blocked airway. There are three kinds of sleep apnea: Obstructive sleep apnea. This kind is caused by a blocked or collapsed airway. Central sleep apnea. This kind happens when the part of the brain that controls breathing does not send the correct signals to the muscles that control breathing. Mixed sleep apnea. This is a combination of obstructive and central sleep apnea. What increases the risk? You are more likely to develop this condition if you: Are overweight. Smoke. Have a smaller than normal airway. Are older. Are male. Drink alcohol. Take sedatives or tranquilizers. Have a family history of sleep apnea. Have a tongue or tonsils that are larger than normal. What are the signs or symptoms? Symptoms of this condition include: Trouble staying asleep. Loud  snoring. Morning headaches. Waking up gasping. Dry mouth or sore throat in the morning. Daytime sleepiness and tiredness. If you have daytime fatigue because of sleep apnea, you may be more likely to have: Trouble concentrating. Forgetfulness. Irritability or mood swings. Personality changes. Feelings of depression. Sexual dysfunction. This may include loss of interest if you are male, or erectile dysfunction if you are male. How is this diagnosed? This condition may be diagnosed with: A medical history. A physical exam. A series of tests that are done while you are sleeping (sleep study). These tests are usually done in a sleep lab, but they may also be done at home. How is this treated? Treatment for this condition aims to restore normal breathing and to ease symptoms during sleep. It may involve managing health issues that can affect breathing, such as high blood pressure or obesity. Treatment may include: Sleeping on your side. Using a decongestant if you have nasal congestion. Avoiding the use of depressants, including alcohol, sedatives, and narcotics. Losing weight if you are overweight. Making changes to your diet. Quitting smoking. Using a device to open your airway while you sleep, such as: An oral appliance. This is a custom-made mouthpiece that shifts your lower jaw forward. A continuous positive airway pressure (CPAP) device. This device blows air through a mask when you breathe out (exhale). A nasal expiratory positive airway pressure (EPAP) device. This device has valves that you put into each nostril. A bi-level positive airway pressure (BIPAP) device. This device blows air through a mask when you breathe in (inhale) and breathe out (exhale). Having surgery  if other treatments do not work. During surgery, excess tissue is removed to create a wider airway. Follow these instructions at home: Lifestyle Make any lifestyle changes that your health care provider  recommends. Eat a healthy, well-balanced diet. Take steps to lose weight if you are overweight. Avoid using depressants, including alcohol, sedatives, and narcotics. Do not use any products that contain nicotine or tobacco. These products include cigarettes, chewing tobacco, and vaping devices, such as e-cigarettes. If you need help quitting, ask your health care provider. General instructions Take over-the-counter and prescription medicines only as told by your health care provider. If you were given a device to open your airway while you sleep, use it only as told by your health care provider. If you are having surgery, make sure to tell your health care provider you have sleep apnea. You may need to bring your device with you. Keep all follow-up visits. This is important. Contact a health care provider if: The device that you received to open your airway during sleep is uncomfortable or does not seem to be working. Your symptoms do not improve. Your symptoms get worse. Get help right away if: You develop: Chest pain. Shortness of breath. Discomfort in your back, arms, or stomach. You have: Trouble speaking. Weakness on one side of your body. Drooping in your face. These symptoms may represent a serious problem that is an emergency. Do not wait to see if the symptoms will go away. Get medical help right away. Call your local emergency services (911 in the [place] Do not drive yourself to the hospital. Summary Sleep apnea is a condition in which breathing pauses or becomes shallow during sleep. The most common cause is a collapsed or blocked airway. The goal of treatment is to restore normal breathing and to ease symptoms during sleep. This information is not intended to replace advice given to you by your health care provider. Make sure you discuss any questions you have with your health care provider. Document Revised: 03/06/2021 Document Reviewed: 07/06/2020 Elsevier Patient  Education  2022 [employer].     CPAP and BIPAP Information CPAP and BIPAP are methods that use air pressure to keep your airways open and to help you breathe well. CPAP and BIPAP use different amounts of pressure. Your health care provider will tell you whether CPAP or BIPAP would be more helpful for you. CPAP stands for "continuous positive airway pressure." With CPAP, the amount of pressure stays the same while you breathe in (inhale) and out (exhale). BIPAP stands for "bi-level positive airway pressure." With BIPAP, the amount of pressure will be higher when you inhale and lower when you exhale. This allows you to take larger breaths. CPAP or BIPAP may be used in the hospital, or your health care provider may want you to use it at home. You may need to have a sleep study before your health care provider can order a machine for you to use at home. What are the advantages? CPAP or BIPAP can be helpful if you have: Sleep apnea. Chronic obstructive pulmonary disease (COPD). Heart failure. Medical conditions that cause muscle weakness, including muscular dystrophy or amyotrophic lateral sclerosis (ALS). Other problems that cause breathing to be shallow, weak, abnormal, or difficult. CPAP and BIPAP are most commonly used for obstructive sleep apnea (OSA) to keep the airways from collapsing when the muscles relax during sleep. What are the risks? Generally, this is a safe treatment. However, problems may occur, including: Irritated skin or skin sores if  the mask does not fit properly. Dry or stuffy nose or nosebleeds. Dry mouth. Feeling gassy or bloated. Sinus or lung infection if the equipment is not cleaned properly. When should CPAP or BIPAP be used? In most cases, the mask only needs to be worn during sleep. Generally, the mask needs to be worn throughout the night and during any daytime naps. People with certain medical conditions may also need to wear the mask at other times, such as  when they are awake. Follow instructions from your health care provider about when to use the machine. What happens during CPAP or BIPAP? Both CPAP and BIPAP are provided by a small machine with a flexible plastic tube that attaches to a plastic mask that you wear. Air is blown through the mask into your nose or mouth. The amount of pressure that is used to blow the air can be adjusted on the machine. Your health care provider will set the pressure setting and help you find the best mask for you. Tips for using the mask Because the mask needs to be snug, some people feel trapped or closed-in (claustrophobic) when first using the mask. If you feel this way, you may need to get used to the mask. One way to do this is to hold the mask loosely over your nose or mouth and then gradually apply the mask more snugly. You can also gradually increase the amount of time that you use the mask. Masks are available in various types and sizes. If your mask does not fit well, talk with your health care provider about getting a different one. Some common types of masks include: Full face masks, which fit over the mouth and nose. Nasal masks, which fit over the nose. Nasal pillow or prong masks, which fit into the nostrils. If you are using a mask that fits over your nose and you tend to breathe through your mouth, a chin strap may be applied to help keep your mouth closed. Use a skin barrier to protect your skin as told by your health care provider. Some CPAP and BIPAP machines have alarms that may sound if the mask comes off or develops a leak. If you have trouble with the mask, it is very important that you talk with your health care provider about finding a way to make the mask easier to tolerate. Do not stop using the mask. There could be a negative impact on your health if you stop using the mask. Tips for using the machine Place your CPAP or BIPAP machine on a secure table or stand near an electrical  outlet. Know where the on/off switch is on the machine. Follow instructions from your health care provider about how to set the pressure on your machine and when you should use it. Do not eat or drink while the CPAP or BIPAP machine is on. Food or fluids could get pushed into your lungs by the pressure of the CPAP or BIPAP. For home use, CPAP and BIPAP machines can be rented or purchased through home health care companies. Many different brands of machines are available. Renting a machine before purchasing may help you find out which particular machine works well for you. Your health insurance company may also decide which machine you may get. Keep the CPAP or BIPAP machine and attachments clean. Ask your health care provider for specific instructions. Check the humidifier if you have a dry stuffy nose or nosebleeds. Make sure it is working correctly. Follow these instructions at  home: Take over-the-counter and prescription medicines only as told by your health care provider. Ask if you can take sinus medicine if your sinuses are blocked. Do not use any products that contain nicotine or tobacco. These products include cigarettes, chewing tobacco, and vaping devices, such as e-cigarettes. If you need help quitting, ask your health care provider. Keep all follow-up visits. This is important. Contact a health care provider if: You have redness or pressure sores on your head, face, mouth, or nose from the mask or head gear. You have trouble using the CPAP or BIPAP machine. You cannot tolerate wearing the CPAP or BIPAP mask. Someone tells you that you snore even when wearing your CPAP or BIPAP. Get help right away if: You have trouble breathing. You feel confused. Summary CPAP and BIPAP are methods that use air pressure to keep your airways open and to help you breathe well. If you have trouble with the mask, it is very important that you talk with your health care provider about finding a way to  make the mask easier to tolerate. Do not stop using the mask. There could be a negative impact to your health if you stop using the mask. Follow instructions from your health care provider about when to use the machine. This information is not intended to replace advice given to you by your health care provider. Make sure you discuss any questions you have with your health care provider. Document Revised: 03/06/2021 Document Reviewed: 07/06/2020 Elsevier Patient Education  2022 [employer].

## 2021-08-21 NOTE — Assessment & Plan Note (Signed)
-   HST 01/15/21 showed moderate OSA, AHI 18.3/hr with SpO2 80%  - Airview download shows patient is 30% compliant with CPAP use. He is having difficulty with pressure setting, feels it is too strong. Current pressure setting 5-10cm h20 (9.1cm h20- 95%) with residual AHI 1.2.  - Recommend changing CPAP to set pressure 8cm h20.  If still having issues recommend CPAP titration study to determine best pressure setting  - Advised he aim to wear CPAP every night for 4-6 hours or more. Do not drive if appears excessive daytime fatigue or somnolence  Orders: Change CPAP pressure 8cm h20   Follow-up: 1 month virtual visit with Eustaquio Maize NP

## 2021-09-30 ENCOUNTER — Telehealth: Payer: Self-pay

## 2021-09-30 ENCOUNTER — Telehealth (INDEPENDENT_AMBULATORY_CARE_PROVIDER_SITE_OTHER): Payer: No Typology Code available for payment source | Admitting: Primary Care

## 2021-09-30 ENCOUNTER — Other Ambulatory Visit: Payer: Self-pay

## 2021-09-30 DIAGNOSIS — G4733 Obstructive sleep apnea (adult) (pediatric): Secondary | ICD-10-CM

## 2021-09-30 NOTE — Addendum Note (Signed)
Addended by: Darliss Ridgel on: 09/30/2021 11:07 AM   Modules accepted: Orders

## 2021-09-30 NOTE — Progress Notes (Signed)
Virtual Visit via Video Note  I connected with Neil Cox on 09/30/21 at 10:30 AM EST by a video enabled telemedicine application and verified that I am speaking with the correct person using two identifiers.  Location: Patient: Home Provider: Office    I discussed the limitations of evaluation and management by telemedicine and the availability of in person appointments. The patient expressed understanding and agreed to proceed.  History of Present Illness: 56 year old male, never smoked.  Past medical history significant for moderate obstructive sleep apnea.  Patient of Dr. Ander Slade, seen for initial consult on 12/19/2020.  Previous [facility] pulmonary encounter:  08/21/2021 Patient presents today for follow-up OSA. He received CPAP machine approx 3 months ago. He called our office in November asking that pressure setting be lowered. Current setting 5-10cm h20. He is able to fall asleep without issue, he wakes up feeling pressure is too strong and he can not fall back asleep so he takes his mask off. He is wearing full face mask size medium. DME company is [employer].   Airview download 07/21/21-08/19/21 Usage 9/30 days (30%); 3 days (10%) > 4 hours Average usage days used 2 hours 47 mins Pressure 5-10cm h20 (9.1cm h20-95%) Airleaks 2.4L/min (95%) AHI 1.2    09/30/2021 - Interim hx  Patient contacted today for 4-6 week follow-up. During our last visit patient was having difficulty with pressure setting, he felt has if it was too strong so we changed CPAP to set pressure 8cm h20. He is still having difficulty getting used to CPAP, feels pressure remains too strong. He has no trouble fallings asleep. If he wakes up at night he can not fall back asleep d/t pressure being too strong. He will take benadryl as needed several night a week with good results. He uses full face size medium. He feels confident that he will be able to get used to CPAP. Continue to encourage compliance.   Airview download  08/28/21-10/06/21 6/30 days (20%); 0 days > 4 hours Average usage days used 1 hour 45 mins Pressure 8cm h20 Airleaks 0.2Lmin (95%) AHI 1.7    Observations/Objective:  - Appears well; No overt shortness of breath, wheezing or cough   Assessment and Plan:  Moderate OSA: - Patient continues to struggle with CPAP use, feels pressure is too strong. No issues with mask fit. Using full face size medium. Current CPAP pressure set at 8cm h20. Residual AHI 1.7/hr. Minimal airleaks. Recommend lowering pressure 5cm h20. If still having difficulty consider in-lab CPAP titration vs oral appliance.   Insomnia: - Patient has difficulty maintaining sleep - Advised he take benadryl 25-50mg  prn insomnia symptoms   Follow Up Instructions:   4-6 week virtual visit with [facility] NP  I discussed the assessment and treatment plan with the patient. The patient was provided an opportunity to ask questions and all were answered. The patient agreed with the plan and demonstrated an understanding of the instructions.   The patient was advised to call back or seek an in-person evaluation if the symptoms worsen or if the condition fails to improve as anticipated.  I provided 22 minutes of non-face-to-face time during this encounter.   Martyn Ehrich, NP

## 2021-09-30 NOTE — Telephone Encounter (Signed)
LMTCB: Follow-up 4 week virtual visit with [facility]

## 2021-09-30 NOTE — Patient Instructions (Signed)
Orders: Change CPAP 5cm h20  Follow-up 4 week virtual visit with [facility]

## 2021-11-04 ENCOUNTER — Ambulatory Visit (INDEPENDENT_AMBULATORY_CARE_PROVIDER_SITE_OTHER): Payer: No Typology Code available for payment source | Admitting: Family Medicine

## 2021-11-04 ENCOUNTER — Encounter: Payer: Self-pay | Admitting: Family Medicine

## 2021-11-04 VITALS — BP 114/60 | HR 78 | Temp 98.1°F | Ht 67.0 in | Wt 167.8 lb

## 2021-11-04 DIAGNOSIS — M7022 Olecranon bursitis, left elbow: Secondary | ICD-10-CM | POA: Diagnosis not present

## 2021-11-04 MED ORDER — METHYLPREDNISOLONE ACETATE 40 MG/ML IJ SUSP
40.0000 mg | Freq: Once | INTRAMUSCULAR | Status: AC
  Administered 2021-11-04: 40 mg via INTRAMUSCULAR

## 2021-11-04 NOTE — Addendum Note (Signed)
Addended by: Nilda Riggs on: 11/04/2021 02:13 PM ? ? Modules accepted: Orders ? ?

## 2021-11-04 NOTE — Progress Notes (Signed)
? ?Established Patient Office Visit ? ?Subjective:  ?Patient ID: Neil Cox, male    DOB: 02/22/1966  Age: 56 y.o. MRN: [id number] ? ?CC:  ?Chief Complaint  ?Patient presents with  ? Bursitis  ?  Patient complains of bursitis on left elbow  ? ? ?HPI ?Neil Cox presents for left elbow swelling for the past several weeks.  Denies any injury.  Possibly mild warmth.  No erythema.  No history of gout.  Has tried some icing without much improvement.  He would like to have this drained. ? ?Past Medical History:  ?Diagnosis Date  ? Allergy   ? Arthritis   ? Cancer [facility]) 2001  ? right thumb cancer/squamous cell  ? DDD (degenerative disc disease), lumbosacral   ? L3-L4  ? GERD (gastroesophageal reflux disease)   ? Hypertension   ? in past  ? ? ?Past Surgical History:  ?Procedure Laterality Date  ? EXCISION [facility]'S NEUROMA    ? 30 years ago  ? thumb surg    ? had cancer right thumb  ? UPPER GASTROINTESTINAL ENDOSCOPY    ? occasional GERD/no meds  ? ? ?Family History  ?Problem Relation Age of Onset  ? Arthritis Father   ? Diabetes Father   ? Osteoarthritis Mother   ? Arthritis Paternal Grandmother   ? Hypertension Paternal Grandmother   ? Diabetes Brother   ? Colon cancer Neg Hx   ? Stomach cancer Neg Hx   ? Rectal cancer Neg Hx   ? ? ?Social History  ? ?Socioeconomic History  ? Marital status: Married  ?  Spouse name: Not on file  ? Number of children: Not on file  ? Years of education: Not on file  ? Highest education level: Not on file  ?Occupational History  ? Not on file  ?Tobacco Use  ? Smoking status: Never  ? Smokeless tobacco: Former  ?  Types: Chew  ?  Quit date: 09/03/1999  ?Vaping Use  ? Vaping Use: Never used  ?Substance and Sexual Activity  ? Alcohol use: No  ? Drug use: No  ? Sexual activity: Not on file  ?Other Topics Concern  ? Not on file  ?Social History Narrative  ? Not on file  ? ?Social Determinants of Health  ? ?Financial Resource Strain: Not on file  ?Food Insecurity: Not on file   ?Transportation Needs: Not on file  ?Physical Activity: Not on file  ?Stress: Not on file  ?Social Connections: Not on file  ?Intimate Partner Violence: Not on file  ? ? ?Outpatient Medications Prior to Visit  ?Medication Sig Dispense Refill  ? Ascorbic Acid (VITA-C PO) Take 500 mg by mouth daily.    ? cholecalciferol (VITAMIN D) 1000 units tablet Take 2,000 Units by mouth daily.    ? COVID-19 At Home Antigen Test [facility] COVID-19 HOME TEST) KIT Use as directd within package instructions 4 each 0  ? Multiple Vitamin (ONE-A-DAY MENS PO) Take by mouth daily.    ? naproxen sodium (ALEVE) 220 MG tablet Take 220 mg by mouth as needed.    ? ?Facility-Administered Medications Prior to Visit  ?Medication Dose Route Frequency Provider Last Rate Last Admin  ? 0.9 %  sodium chloride infusion  500 mL Intravenous Once Pyrtle, Lajuan Lines, MD      ? ? ?No Known Allergies ? ?ROS ?Review of Systems  ?Constitutional:  Negative for chills and fever.  ? ?  ?Objective:  ?  ?Physical Exam ?Vitals reviewed.  ?  Constitutional:   ?   Appearance: Normal appearance.  ?Cardiovascular:  ?   Rate and Rhythm: Normal rate.  ?Musculoskeletal:  ?   Comments: Left elbow full range of motion.  He has some swelling of the olecranon bursa.  No erythema.  No significant warmth.  Minimally tender.  ?Neurological:  ?   Mental Status: He is alert.  ? ? ?BP 114/60 (BP Location: Left Arm, Patient Position: Sitting, Cuff Size: Normal)   Pulse 78   Temp 98.1 ?F (36.7 ?C) (Oral)   Ht _0  (1.702 m)   Wt 167 lb 12.8 oz (76.1 kg)   SpO2 96%   BMI 26.28 kg/m?  ?Wt Readings from Last 3 Encounters:  ?11/04/21 167 lb 12.8 oz (76.1 kg)  ?08/21/21 170 lb 3.2 oz (77.2 kg)  ?04/24/21 165 lb 3.2 oz (74.9 kg)  ? ? ? ?Health Maintenance Due  ?Topic Date Due  ? HIV Screening  Never done  ? Hepatitis C Screening  Never done  ? Zoster Vaccines- Shingrix (1 of 2) Never done  ? COVID-19 Vaccine (3 - Pfizer risk series) 10/15/2019  ? ? ?There are no preventive care reminders  to display for this patient. ? ?Lab Results  ?Component Value Date  ? TSH 0.65 01/02/2020  ? ?Lab Results  ?Component Value Date  ? WBC 4.8 01/02/2020  ? HGB 14.3 01/02/2020  ? HCT 41.6 01/02/2020  ? MCV 87.7 01/02/2020  ? PLT 184.0 01/02/2020  ? ?Lab Results  ?Component Value Date  ? NA 138 01/02/2020  ? K 4.5 01/02/2020  ? CO2 29 01/02/2020  ? GLUCOSE 112 (H) 01/02/2020  ? BUN 15 01/02/2020  ? CREATININE 0.95 01/02/2020  ? BILITOT 0.9 01/02/2020  ? ALKPHOS 38 (L) 01/02/2020  ? AST 18 01/02/2020  ? ALT 14 01/02/2020  ? PROT 6.7 01/02/2020  ? ALBUMIN 4.5 01/02/2020  ? CALCIUM 9.5 01/02/2020  ? GFR 82.94 01/02/2020  ? ?Lab Results  ?Component Value Date  ? CHOL 190 01/02/2020  ? ?Lab Results  ?Component Value Date  ? HDL 68.30 01/02/2020  ? ?Lab Results  ?Component Value Date  ? LDLCALC 108 (H) 01/02/2020  ? ?Lab Results  ?Component Value Date  ? TRIG 69.0 01/02/2020  ? ?Lab Results  ?Component Value Date  ? CHOLHDL 3 01/02/2020  ? ?No results found for: HGBA1C ? ?  ?Assessment & Plan:  ? ?Left olecranon bursitis.  No evidence for infection.  No evidence to suggest gout.  Patient requesting drainage.  We explained that these frequently recur.  We discussed risk and benefits of aspiration and corticosteroid injection and patient consented. ? ?-Skin prepped with Betadine.  Using number 22-gauge 1 inch needle aspirated 5 cc of serosanguineous clear fluid ?-We then used number 25-gauge 1 inch needle and injected 20 mg of Depo-Medrol.  Patient tolerated well.  Pressure bandage with Coban applied ?-Keep pressure off elbow ?-Continue some icing as needed ?-Follow-up as needed ? ?No orders of the defined types were placed in this encounter. ? ? ?Follow-up: No follow-ups on file.  ? ? ?Carolann Littler, MD ?

## 2021-12-18 ENCOUNTER — Encounter: Payer: Self-pay | Admitting: Primary Care

## 2021-12-18 ENCOUNTER — Ambulatory Visit: Payer: No Typology Code available for payment source | Admitting: Primary Care

## 2021-12-18 VITALS — BP 118/78 | HR 72 | Temp 97.9°F | Ht 67.0 in | Wt 167.2 lb

## 2021-12-18 DIAGNOSIS — G4733 Obstructive sleep apnea (adult) (pediatric): Secondary | ICD-10-CM

## 2021-12-18 NOTE — Assessment & Plan Note (Addendum)
-   HST 01/15/21 showed moderate OSA, AHI 18.3/hr with SpO2 80% ?- Difficulty tolerating CPAP pressure setting and full face mask, we have lowered pressure setting without improvement. He needs mask desensitization study. If ultimately fails CPAP may consider oral appliance or referral to ENT  ?- FU in 6-8 weeks for compliance check ?

## 2021-12-18 NOTE — Patient Instructions (Signed)
Orders: ?Mask desensitization/fitting with sleep lab  ?DME order will be placed once study has been completed  ? ?Follow-up: ?8-12 weeks with Beth NP for CPAP compliance  ? ? ?CPAP and BIPAP Information ?CPAP and BIPAP are methods that use air pressure to keep your airways open and to help you breathe well. CPAP and BIPAP use different amounts of pressure. Your health care provider will tell you whether CPAP or BIPAP would be more helpful for you. ?CPAP stands for "continuous positive airway pressure." With CPAP, the amount of pressure stays the same while you breathe in (inhale) and out (exhale). ?BIPAP stands for "bi-level positive airway pressure." With BIPAP, the amount of pressure will be higher when you inhale and lower when you exhale. This allows you to take larger breaths. ?CPAP or BIPAP may be used in the hospital, or your health care provider may want you to use it at home. You may need to have a sleep study before your health care provider can order a machine for you to use at home. ?What are the advantages? ?CPAP or BIPAP can be helpful if you have: ?Sleep apnea. ?Chronic obstructive pulmonary disease (COPD). ?Heart failure. ?Medical conditions that cause muscle weakness, including muscular dystrophy or amyotrophic lateral sclerosis (ALS). ?Other problems that cause breathing to be shallow, weak, abnormal, or difficult. ?CPAP and BIPAP are most commonly used for obstructive sleep apnea (OSA) to keep the airways from collapsing when the muscles relax during sleep. ?What are the risks? ?Generally, this is a safe treatment. However, problems may occur, including: ?Irritated skin or skin sores if the mask does not fit properly. ?Dry or stuffy nose or nosebleeds. ?Dry mouth. ?Feeling gassy or bloated. ?Sinus or lung infection if the equipment is not cleaned properly. ?When should CPAP or BIPAP be used? ?In most cases, the mask only needs to be worn during sleep. Generally, the mask needs to be worn  throughout the night and during any daytime naps. People with certain medical conditions may also need to wear the mask at other times, such as when they are awake. Follow instructions from your health care provider about when to use the machine. ?What happens during CPAP or BIPAP? ? ?Both CPAP and BIPAP are provided by a small machine with a flexible plastic tube that attaches to a plastic mask that you wear. Air is blown through the mask into your nose or mouth. The amount of pressure that is used to blow the air can be adjusted on the machine. Your health care provider will set the pressure setting and help you find the best mask for you. ?Tips for using the mask ?Because the mask needs to be snug, some people feel trapped or closed-in (claustrophobic) when first using the mask. If you feel this way, you may need to get used to the mask. One way to do this is to hold the mask loosely over your nose or mouth and then gradually apply the mask more snugly. You can also gradually increase the amount of time that you use the mask. ?Masks are available in various types and sizes. If your mask does not fit well, talk with your health care provider about getting a different one. Some common types of masks include: ?Full face masks, which fit over the mouth and nose. ?Nasal masks, which fit over the nose. ?Nasal pillow or prong masks, which fit into the nostrils. ?If you are using a mask that fits over your nose and you tend to breathe through  your mouth, a chin strap may be applied to help keep your mouth closed. ?Use a skin barrier to protect your skin as told by your health care provider. ?Some CPAP and BIPAP machines have alarms that may sound if the mask comes off or develops a leak. ?If you have trouble with the mask, it is very important that you talk with your health care provider about finding a way to make the mask easier to tolerate. Do not stop using the mask. There could be a negative impact on your health if  you stop using the mask. ?Tips for using the machine ?Place your CPAP or BIPAP machine on a secure table or stand near an electrical outlet. ?Know where the on/off switch is on the machine. ?Follow instructions from your health care provider about how to set the pressure on your machine and when you should use it. ?Do not eat or drink while the CPAP or BIPAP machine is on. Food or fluids could get pushed into your lungs by the pressure of the CPAP or BIPAP. ?For home use, CPAP and BIPAP machines can be rented or purchased through home health care companies. Many different brands of machines are available. Renting a machine before purchasing may help you find out which particular machine works well for you. Your health insurance company may also decide which machine you may get. ?Keep the CPAP or BIPAP machine and attachments clean. Ask your health care provider for specific instructions. ?Check the humidifier if you have a dry stuffy nose or nosebleeds. Make sure it is working correctly. ?Follow these instructions at home: ?Take over-the-counter and prescription medicines only as told by your health care provider. Ask if you can take sinus medicine if your sinuses are blocked. ?Do not use any products that contain nicotine or tobacco. These products include cigarettes, chewing tobacco, and vaping devices, such as e-cigarettes. If you need help quitting, ask your health care provider. ?Keep all follow-up visits. This is important. ?Contact a health care provider if: ?You have redness or pressure sores on your head, face, mouth, or nose from the mask or head gear. ?You have trouble using the CPAP or BIPAP machine. ?You cannot tolerate wearing the CPAP or BIPAP mask. ?Someone tells you that you snore even when wearing your CPAP or BIPAP. ?Get help right away if: ?You have trouble breathing. ?You feel confused. ?Summary ?CPAP and BIPAP are methods that use air pressure to keep your airways open and to help you breathe  well. ?If you have trouble with the mask, it is very important that you talk with your health care provider about finding a way to make the mask easier to tolerate. Do not stop using the mask. There could be a negative impact to your health if you stop using the mask. ?Follow instructions from your health care provider about when to use the machine. ?This information is not intended to replace advice given to you by your health care provider. Make sure you discuss any questions you have with your health care provider. ?Document Revised: 03/06/2021 Document Reviewed: 07/06/2020 ?Elsevier Patient Education ? [place]. ? ?

## 2021-12-18 NOTE — Progress Notes (Signed)
? ?_0  ID: Neil Cox, male    DOB: 09-23-1965, 56 y.o.   MRN: [id number] ? ?Chief Complaint  ?Patient presents with  ? Follow-up  ?  Pt states he is having a hard time trying to get used to the full face mask. States he is able to go to sleep fine but if wakes up in the middle of the night, has a hard time trying to go back to sleep and wearing it after that. Pt wants to discuss possibly switching to the nasal pillows mask.  ? ? ?Referring provider: ?Eulas Post, MD ? ?HPI: ?56 year old male, never smoked.  Past medical history significant for moderate obstructive sleep apnea.  Patient of Dr. Ander Slade, seen for initial consult on 12/19/2020. ? ?Previous [facility] pulmonary encounter:  ?08/21/2021 ?Patient presents today for follow-up OSA. He received CPAP machine approx 3 months ago. He called our office in November asking that pressure setting be lowered. Current setting 5-10cm h20. He is able to fall asleep without issue, he wakes up feeling pressure is too strong and he can not fall back asleep so he takes his mask off. He is wearing full face mask size medium. DME company is [employer].  ? ?Airview download 07/21/21-08/19/21 ?Usage 9/30 days (30%); 3 days (10%) > 4 hours ?Average usage days used 2 hours 47 mins ?Pressure 5-10cm h20 (9.1cm h20-95%) ?Airleaks 2.4L/min (95%) ?AHI 1.2  ? ?09/30/2021  ?Patient contacted today for 4-6 week follow-up. During our last visit patient was having difficulty with pressure setting, he felt has if it was too strong so we changed CPAP to set pressure 8cm h20. He is still having difficulty getting used to CPAP, feels pressure remains too strong. He has no trouble fallings asleep. If he wakes up at night he can not fall back asleep d/t pressure being too strong. He will take benadryl as needed several night a week with good results. He uses full face size medium. He feels confident that he will be able to get used to CPAP. Continue to encourage compliance.  ? ?Airview download  08/28/21-10/06/21 ?6/30 days (20%); 0 days > 4 hours ?Average usage days used 1 hour 45 mins ?Pressure 8cm h20 ?Airleaks 0.2Lmin (95%) ?AHI 1.7  ? ?12/18/2021- interim hx  ?Patient presents today for OSA follow-up. He continues to have a hard time adjusting to CPAP and difficulty time tolerating full face mask. We lowered his pressure setting in the past. Currently set at 6cm h20. He would like to try nasal mask. Unsure if he is a mouth breather. Asking about alternatives to cpap like mouth taping and inap which he has seen on youtube.  ? ?Airview download 11/16/21-12/15/21 ?Usage 3/30 days (10%); 1 day (3%) > 4 hours ?Average usage days used 3 hours 3 mins ?Pressure 6cm h20  ?Airleaks 0.0L/min ?AHI 0.4 ? ?No Known Allergies ? ?Immunization History  ?Administered Date(s) Administered  ? Influenza Inj Mdck Quad Pf 05/11/2021  ? Influenza Split 05/23/2013  ? Influenza,inj,Quad PF,6+ Mos 05/18/2014, 05/22/2015, 05/09/2016, 05/20/2018  ? PFIZER(Purple Top)SARS-COV-2 Vaccination 08/29/2019, 09/17/2019  ? Rabies, IM 03/06/2015, 03/09/2015, 03/13/2015, 03/24/2015  ? Td 08/12/2003  ? Tdap 05/08/2014  ? ? ?Past Medical History:  ?Diagnosis Date  ? Allergy   ? Arthritis   ? Cancer [facility] 2001  ? right thumb cancer/squamous cell  ? DDD (degenerative disc disease), lumbosacral   ? L3-L4  ? GERD (gastroesophageal reflux disease)   ? Hypertension   ? in past  ? ? ?Tobacco History: ?  Social History  ? ?Tobacco Use  ?Smoking Status Never  ?Smokeless Tobacco Former  ? Types: Chew  ? Quit date: 09/03/1999  ? ?Counseling given: Not Answered ? ? ?Outpatient Medications Prior to Visit  ?Medication Sig Dispense Refill  ? Ascorbic Acid (VITA-C PO) Take 500 mg by mouth daily.    ? cholecalciferol (VITAMIN D) 1000 units tablet Take 2,000 Units by mouth daily.    ? COVID-19 At Home Antigen Test [facility] COVID-19 HOME TEST) KIT Use as directd within package instructions 4 each 0  ? Multiple Vitamin (ONE-A-DAY MENS PO) Take by mouth daily.    ?  naproxen sodium (ALEVE) 220 MG tablet Take 220 mg by mouth as needed.    ? ?Facility-Administered Medications Prior to Visit  ?Medication Dose Route Frequency Provider Last Rate Last Admin  ? 0.9 %  sodium chloride infusion  500 mL Intravenous Once Pyrtle, Lajuan Lines, MD      ? ? ?Review of Systems ? ?Review of Systems  ?Constitutional: Negative.   ?HENT: Negative.    ?Respiratory: Negative.    ? ? ?Physical Exam ? ?BP 118/78 (BP Location: Left Arm, Patient Position: Sitting, Cuff Size: Normal)   Pulse 72   Temp 97.9 ?F (36.6 ?C) (Oral)   Ht _0  (1.702 m)   Wt 167 lb 3.2 oz (75.8 kg)   SpO2 100% Comment: RA  BMI 26.19 kg/m?  ?Physical Exam ?Constitutional:   ?   Appearance: Normal appearance.  ?HENT:  ?   Head: Normocephalic and atraumatic.  ?   Mouth/Throat:  ?   Mouth: Mucous membranes are moist.  ?   Pharynx: Oropharynx is clear.  ?   Comments: Mallampati class I ?Cardiovascular:  ?   Rate and Rhythm: Normal rate and regular rhythm.  ?Pulmonary:  ?   Effort: Pulmonary effort is normal.  ?   Breath sounds: Normal breath sounds.  ?Musculoskeletal:     ?   General: Normal range of motion.  ?Skin: ?   General: Skin is warm and dry.  ?Neurological:  ?   General: No focal deficit present.  ?   Mental Status: He is alert and oriented to person, place, and time. Mental status is at baseline.  ?Psychiatric:     ?   Mood and Affect: Mood normal.     ?   Behavior: Behavior normal.     ?   Thought Content: Thought content normal.     ?   Judgment: Judgment normal.  ?  ? ?Lab Results: ? ?CBC ?   ?Component Value Date/Time  ? WBC 4.8 01/02/2020 1109  ? RBC 4.74 01/02/2020 1109  ? HGB 14.3 01/02/2020 1109  ? HCT 41.6 01/02/2020 1109  ? PLT 184.0 01/02/2020 1109  ? MCV 87.7 01/02/2020 1109  ? MCHC 34.5 01/02/2020 1109  ? RDW 13.7 01/02/2020 1109  ? LYMPHSABS 1.9 01/02/2020 1109  ? MONOABS 0.3 01/02/2020 1109  ? EOSABS 0.0 01/02/2020 1109  ? BASOSABS 0.0 01/02/2020 1109  ? ? ?BMET ?   ?Component Value Date/Time  ? NA 138  01/02/2020 1109  ? K 4.5 01/02/2020 1109  ? CL 102 01/02/2020 1109  ? CO2 29 01/02/2020 1109  ? GLUCOSE 112 (H) 01/02/2020 1109  ? BUN 15 01/02/2020 1109  ? CREATININE 0.95 01/02/2020 1109  ? CALCIUM 9.5 01/02/2020 1109  ? GFRNONAA 77.89 05/21/2009 0758  ? ? ?BNP ?No results found for: BNP ? ?ProBNP ?No results found for: PROBNP ? ?Imaging: ?No  results found. ? ? ?Assessment & Plan:  ? ?OSA (obstructive sleep apnea) ?- HST 01/15/21 showed moderate OSA, AHI 18.3/hr with SpO2 80% ?- Difficulty tolerating CPAP pressure setting and full face mask, we have lowered pressure setting without improvement. He needs mask desensitization study. If ultimately fails CPAP may consider oral appliance or referral to ENT  ?- FU in 6-8 weeks for compliance check ? ? ? ? ?Martyn Ehrich, NP ?12/18/2021 ? ?

## 2022-01-07 ENCOUNTER — Ambulatory Visit (HOSPITAL_BASED_OUTPATIENT_CLINIC_OR_DEPARTMENT_OTHER): Payer: No Typology Code available for payment source | Attending: Primary Care | Admitting: Pulmonary Disease

## 2022-01-07 DIAGNOSIS — G4733 Obstructive sleep apnea (adult) (pediatric): Secondary | ICD-10-CM

## 2022-02-03 ENCOUNTER — Other Ambulatory Visit (HOSPITAL_BASED_OUTPATIENT_CLINIC_OR_DEPARTMENT_OTHER): Payer: No Typology Code available for payment source | Admitting: Pulmonary Disease

## 2022-02-19 ENCOUNTER — Encounter: Payer: Self-pay | Admitting: Primary Care

## 2022-02-19 ENCOUNTER — Ambulatory Visit: Payer: No Typology Code available for payment source | Admitting: Primary Care

## 2022-02-19 VITALS — BP 118/66 | HR 68 | Temp 98.4°F | Ht 67.0 in | Wt 166.0 lb

## 2022-02-19 DIAGNOSIS — G4733 Obstructive sleep apnea (adult) (pediatric): Secondary | ICD-10-CM | POA: Diagnosis not present

## 2022-02-19 NOTE — Progress Notes (Signed)
_0  ID: Neil Cox, male    DOB: 1965/12/02, 56 y.o.   MRN: [id number]  Chief Complaint  Patient presents with   Follow-up    Pt states he has been doing okay since last visit. States CPAP is working better for him.    Referring provider: Eulas Post, MD  HPI: 56 year old male, never smoked.  Past medical history significant for moderate obstructive sleep apnea.  Patient of Dr. Ander Slade, seen for initial consult on 12/19/2020.  Previous [facility] pulmonary encounter:  08/21/2021 Patient presents today for follow-up OSA. He received CPAP machine approx 3 months ago. He called our office in November asking that pressure setting be lowered. Current setting 5-10cm h20. He is able to fall asleep without issue, he wakes up feeling pressure is too strong and he can not fall back asleep so he takes his mask off. He is wearing full face mask size medium. DME company is [employer].   Airview download 07/21/21-08/19/21 Usage 9/30 days (30%); 3 days (10%) > 4 hours Average usage days used 2 hours 47 mins Pressure 5-10cm h20 (9.1cm h20-95%) Airleaks 2.4L/min (95%) AHI 1.2   09/30/2021  Patient contacted today for 4-6 week follow-up. During our last visit patient was having difficulty with pressure setting, he felt has if it was too strong so we changed CPAP to set pressure 8cm h20. He is still having difficulty getting used to CPAP, feels pressure remains too strong. He has no trouble fallings asleep. If he wakes up at night he can not fall back asleep d/t pressure being too strong. He will take benadryl as needed several night a week with good results. He uses full face size medium. He feels confident that he will be able to get used to CPAP. Continue to encourage compliance.   Airview download 08/28/21-10/06/21 6/30 days (20%); 0 days > 4 hours Average usage days used 1 hour 45 mins Pressure 8cm h20 Airleaks 0.2Lmin (95%) AHI 1.7   12/18/2021 Patient presents today for OSA follow-up. He  continues to have a hard time adjusting to CPAP and difficulty time tolerating full face mask. We lowered his pressure setting in the past. Currently set at 6cm h20. He would like to try nasal mask. Unsure if he is a mouth breather. Asking about alternatives to cpap like mouth taping and inap which he has seen on youtube.   Airview download 11/16/21-12/15/21 Usage 3/30 days (10%); 1 day (3%) > 4 hours Average usage days used 3 hours 3 mins Pressure 6cm h20  Airleaks 0.0L/min AHI 0.4  02/19/2022- Interim hx  Patient presents today for 8 week follow-up. HST  01/15/21 showed moderate OSA, AHI 18.3/hr with SpO2 low 80%. Started on CPAP June 2022. During our last visit we discussed working on increasing compliance with CPAP use. He was having a hard time tolerating full face mask and wanted to try changing to nasal mask. We have lowered his pressures in the past.   He is doing well today.  Most recent download shows improvement in compliance with CPAP use.  He had a mask fitting/desensitization study since our last visit and was given F&P Evora fullface mask.  He is no longer snoring and sleeping better at night. Certain days he has noticed less yawning. He would like to change to different DME companies, currently with [employer] and having issues with billing.   Airview download 01/19/22-02/17/22 Usage 25/30 days (83%); 16 days (53%) > 4 hours Average usage 3 hours 38 mins Pressure 6cm h20  Airleaks 4.6L/min (95%) AHI 4.9  No Known Allergies  Immunization History  Administered Date(s) Administered   Influenza Inj Mdck Quad Pf 05/11/2021   Influenza Split 05/23/2013   Influenza,inj,Quad PF,6+ Mos 05/18/2014, 05/22/2015, 05/09/2016, 05/20/2018   PFIZER(Purple Top)SARS-COV-2 Vaccination 08/29/2019, 09/17/2019   Rabies, IM 03/06/2015, 03/09/2015, 03/13/2015, 03/24/2015   Td 08/12/2003   Tdap 05/08/2014    Past Medical History:  Diagnosis Date   Allergy    Arthritis    Cancer ([place]) 2001   right thumb  cancer/squamous cell   DDD (degenerative disc disease), lumbosacral    L3-L4   GERD (gastroesophageal reflux disease)    Hypertension    in past    Tobacco History: Social History   Tobacco Use  Smoking Status Never  Smokeless Tobacco Former   Types: Chew   Quit date: 09/03/1999   Counseling given: Not Answered   Outpatient Medications Prior to Visit  Medication Sig Dispense Refill   Ascorbic Acid (VITA-C PO) Take 500 mg by mouth daily.     cholecalciferol (VITAMIN D) 1000 units tablet Take 2,000 Units by mouth daily.     Multiple Vitamin (ONE-A-DAY MENS PO) Take by mouth daily.     naproxen sodium (ALEVE) 220 MG tablet Take 220 mg by mouth as needed.     COVID-19 At Home Antigen Test [facility] COVID-19 HOME TEST) KIT Use as directd within package instructions 4 each 0   Facility-Administered Medications Prior to Visit  Medication Dose Route Frequency Provider Last Rate Last Admin   0.9 %  sodium chloride infusion  500 mL Intravenous Once Pyrtle, Lajuan Lines, MD        Review of Systems  Review of Systems  Constitutional:  Positive for fatigue.  HENT: Negative.    Respiratory:  Negative for apnea.   Cardiovascular: Negative.   Psychiatric/Behavioral:  Negative for sleep disturbance.     Physical Exam  BP 118/66 (BP Location: Left Arm, Patient Position: Sitting, Cuff Size: Normal)   Pulse 68   Temp 98.4 F (36.9 C) (Oral)   Ht _0  (1.702 m)   Wt 166 lb (75.3 kg)   SpO2 99% Comment: RA  BMI 26.00 kg/m  Physical Exam Constitutional:      Appearance: Normal appearance.  HENT:     Head: Normocephalic and atraumatic.  Cardiovascular:     Rate and Rhythm: Normal rate.  Pulmonary:     Effort: Pulmonary effort is normal.  Skin:    General: Skin is warm and dry.  Neurological:     General: No focal deficit present.     Mental Status: He is alert and oriented to person, place, and time. Mental status is at baseline.  Psychiatric:        Mood and Affect: Mood  normal.        Behavior: Behavior normal.        Thought Content: Thought content normal.        Judgment: Judgment normal.      Lab Results:  CBC    Component Value Date/Time   WBC 4.8 01/02/2020 1109   RBC 4.74 01/02/2020 1109   HGB 14.3 01/02/2020 1109   HCT 41.6 01/02/2020 1109   PLT 184.0 01/02/2020 1109   MCV 87.7 01/02/2020 1109   MCHC 34.5 01/02/2020 1109   RDW 13.7 01/02/2020 1109   LYMPHSABS 1.9 01/02/2020 1109   MONOABS 0.3 01/02/2020 1109   EOSABS 0.0 01/02/2020 1109   BASOSABS 0.0 01/02/2020 1109    BMET  Component Value Date/Time   NA 138 01/02/2020 1109   K 4.5 01/02/2020 1109   CL 102 01/02/2020 1109   CO2 29 01/02/2020 1109   GLUCOSE 112 (H) 01/02/2020 1109   BUN 15 01/02/2020 1109   CREATININE 0.95 01/02/2020 1109   CALCIUM 9.5 01/02/2020 1109   GFRNONAA 77.89 05/21/2009 0758    BNP No results found for: "BNP"  ProBNP No results found for: "PROBNP"  Imaging: No results found.   Assessment & Plan:   OSA (obstructive sleep apnea) - Home sleep study on 01/15/2021 showed moderate obstructive sleep apnea, AHI 18.3/hr. started on CPAP in June 2022, patient has had significant difficulty tolerating CPAP.  We have lowered pressure several times.  Recently had mask fitting/desensitization study and was given F&P Evora full face mask.  Most recent download from 01/19/2022 - 02/17/2022 showed improvement in compliance.  Average usage 8 hours and 38 minutes.  Pressure 6 cm H2O; AHI 4.9.  Recommend increasing pressure to 8 cm H2O due to the residual apneas.  Patient will continue to wear CPAP nightly for minimum 4 to 6 hours.  Would like to change DME companies form [employer] for CPAP supplies.  Follow-up in 6 months or sooner if needed.  30 mins spent on case: > 50% face to face with patient   Martyn Ehrich, NP 02/19/2022

## 2022-02-19 NOTE — Assessment & Plan Note (Signed)
-   Home sleep study on 01/15/2021 showed moderate obstructive sleep apnea, AHI 18.3/hr. started on CPAP in June 2022, patient has had significant difficulty tolerating CPAP.  We have lowered pressure several times.  Recently had mask fitting/desensitization study and was given F&P Evora full face mask.  Most recent download from 01/19/2022 - 02/17/2022 showed improvement in compliance.  Average usage 8 hours and 38 minutes.  Pressure 6 cm H2O; AHI 4.9.  Recommend increasing pressure to 8 cm H2O due to the residual apneas.  Patient will continue to wear CPAP nightly for minimum 4 to 6 hours.  Would like to change DME companies form [employer] for CPAP supplies.  Follow-up in 6 months or sooner if needed.

## 2022-02-19 NOTE — Patient Instructions (Addendum)
Great work with CPAP compliance, we have made significant improvement recently  Current pressure setting is 6cm h20, recommend increasing pressure 8cm h20 d/t some residual apneas you are having. If this pressure seems too strong let us know and we can lower to 7cm h20 or go back to 6cm h20  We will try to see if we can change DME companies for CPAP supplies, you may want to consider looking'[email or web address] to get you on CPAP supplies.  You may need prescription for [place], just notify us with mask type and size and we can provide this for you  Recommendations: Continue to wear CPAP every night for 4-6 hour or more Do not drive experiencing excessive daytime sleepiness or fatigue  Orders: Change CPAP pressure to 8 cm H2O (Current DME company is [employer]) See if we can change DME companies for CPAP supplies   Follow-up 6 months with Dr. Ander Slade or [facility] NP/ or sooner if needed   CPAP and BIPAP Information CPAP and BIPAP are methods that use air pressure to keep your airways open and to help you breathe well. CPAP and BIPAP use different amounts of pressure. Your health care provider will tell you whether CPAP or BIPAP would be more helpful for you. CPAP stands for "continuous positive airway pressure." With CPAP, the amount of pressure stays the same while you breathe in (inhale) and out (exhale). BIPAP stands for "bi-level positive airway pressure." With BIPAP, the amount of pressure will be higher when you inhale and lower when you exhale. This allows you to take larger breaths. CPAP or BIPAP may be used in the hospital, or your health care provider may want you to use it at home. You may need to have a sleep study before your health care provider can order a machine for you to use at home. What are the advantages? CPAP or BIPAP can be helpful if you have: Sleep apnea. Chronic obstructive pulmonary disease (COPD). Heart failure. Medical conditions that cause muscle weakness, including muscular dystrophy  or amyotrophic lateral sclerosis (ALS). Other problems that cause breathing to be shallow, weak, abnormal, or difficult. CPAP and BIPAP are most commonly used for obstructive sleep apnea (OSA) to keep the airways from collapsing when the muscles relax during sleep. What are the risks? Generally, this is a safe treatment. However, problems may occur, including: Irritated skin or skin sores if the mask does not fit properly. Dry or stuffy nose or nosebleeds. Dry mouth. Feeling gassy or bloated. Sinus or lung infection if the equipment is not cleaned properly. When should CPAP or BIPAP be used? In most cases, the mask only needs to be worn during sleep. Generally, the mask needs to be worn throughout the night and during any daytime naps. People with certain medical conditions may also need to wear the mask at other times, such as when they are awake. Follow instructions from your health care provider about when to use the machine. What happens during CPAP or BIPAP?  Both CPAP and BIPAP are provided by a small machine with a flexible plastic tube that attaches to a plastic mask that you wear. Air is blown through the mask into your nose or mouth. The amount of pressure that is used to blow the air can be adjusted on the machine. Your health care provider will set the pressure setting and help you find the best mask for you. Tips for using the mask Because the mask needs to be snug, some people feel trapped or closed-in (claustrophobic)  when first using the mask. If you feel this way, you may need to get used to the mask. One way to do this is to hold the mask loosely over your nose or mouth and then gradually apply the mask more snugly. You can also gradually increase the amount of time that you use the mask. Masks are available in various types and sizes. If your mask does not fit well, talk with your health care provider about getting a different one. Some common types of masks include: Full face  masks, which fit over the mouth and nose. Nasal masks, which fit over the nose. Nasal pillow or prong masks, which fit into the nostrils. If you are using a mask that fits over your nose and you tend to breathe through your mouth, a chin strap may be applied to help keep your mouth closed. Use a skin barrier to protect your skin as told by your health care provider. Some CPAP and BIPAP machines have alarms that may sound if the mask comes off or develops a leak. If you have trouble with the mask, it is very important that you talk with your health care provider about finding a way to make the mask easier to tolerate. Do not stop using the mask. There could be a negative impact on your health if you stop using the mask. Tips for using the machine Place your CPAP or BIPAP machine on a secure table or stand near an electrical outlet. Know where the on/off switch is on the machine. Follow instructions from your health care provider about how to set the pressure on your machine and when you should use it. Do not eat or drink while the CPAP or BIPAP machine is on. Food or fluids could get pushed into your lungs by the pressure of the CPAP or BIPAP. For home use, CPAP and BIPAP machines can be rented or purchased through home health care companies. Many different brands of machines are available. Renting a machine before purchasing may help you find out which particular machine works well for you. Your health insurance company may also decide which machine you may get. Keep the CPAP or BIPAP machine and attachments clean. Ask your health care provider for specific instructions. Check the humidifier if you have a dry stuffy nose or nosebleeds. Make sure it is working correctly. Follow these instructions at home: Take over-the-counter and prescription medicines only as told by your health care provider. Ask if you can take sinus medicine if your sinuses are blocked. Do not use any products that contain  nicotine or tobacco. These products include cigarettes, chewing tobacco, and vaping devices, such as e-cigarettes. If you need help quitting, ask your health care provider. Keep all follow-up visits. This is important. Contact a health care provider if: You have redness or pressure sores on your head, face, mouth, or nose from the mask or head gear. You have trouble using the CPAP or BIPAP machine. You cannot tolerate wearing the CPAP or BIPAP mask. Someone tells you that you snore even when wearing your CPAP or BIPAP. Get help right away if: You have trouble breathing. You feel confused. Summary CPAP and BIPAP are methods that use air pressure to keep your airways open and to help you breathe well. If you have trouble with the mask, it is very important that you talk with your health care provider about finding a way to make the mask easier to tolerate. Do not stop using the mask. There could  be a negative impact to your health if you stop using the mask. Follow instructions from your health care provider about when to use the machine. This information is not intended to replace advice given to you by your health care provider. Make sure you discuss any questions you have with your health care provider. Document Revised: 03/06/2021 Document Reviewed: 07/06/2020 Elsevier Patient Education  [place].

## 2022-02-26 ENCOUNTER — Encounter: Payer: Self-pay | Admitting: Family Medicine

## 2022-02-26 ENCOUNTER — Ambulatory Visit (INDEPENDENT_AMBULATORY_CARE_PROVIDER_SITE_OTHER): Payer: No Typology Code available for payment source | Admitting: Family Medicine

## 2022-02-26 VITALS — BP 130/60 | HR 85 | Temp 98.2°F | Ht 67.0 in | Wt 165.9 lb

## 2022-02-26 DIAGNOSIS — J209 Acute bronchitis, unspecified: Secondary | ICD-10-CM

## 2022-02-26 NOTE — Progress Notes (Signed)
Established Patient Office Visit  Subjective   Patient ID: Neil Cox, male    DOB: October 26, 1965  Age: 55 y.o. MRN: [id number]  Chief Complaint  Patient presents with   Fatigue    Patient complains of fatigue, x1 week,    Congestion     Patient complains of chest congestion, x1 week    Cough    Patient complains of cough, x1 week, Productive with yellow sputum     HPI   Seen with upper respiratory symptoms past week.  About a week ago he developed some mild sore throat.  This was followed by nasal congestion and now productive cough.  No fever.  No chills.  No dyspnea.  Has had some intermittent hoarseness.  Planning family vacation out [place] in a couple weeks.  He is a non-smoker.  No history of asthma.  Past Medical History:  Diagnosis Date   Allergy    Arthritis    Cancer ([place]) 2001   right thumb cancer/squamous cell   DDD (degenerative disc disease), lumbosacral    L3-L4   GERD (gastroesophageal reflux disease)    Hypertension    in past   Past Surgical History:  Procedure Laterality Date   EXCISION MORTON'S NEUROMA     30 years ago   thumb surg     had cancer right thumb   UPPER GASTROINTESTINAL ENDOSCOPY     occasional GERD/no meds    reports that he has never smoked. He quit smokeless tobacco use about 22 years ago.  His smokeless tobacco use included chew. He reports that he does not drink alcohol and does not use drugs. family history includes Arthritis in his father and paternal grandmother; Diabetes in his brother and father; Hypertension in his paternal grandmother; Osteoarthritis in his mother. No Known Allergies  Review of Systems  Constitutional:  Negative for chills and fever.  HENT:  Positive for congestion. Negative for sinus pain.   Respiratory:  Positive for cough. Negative for hemoptysis, shortness of breath and wheezing.   Cardiovascular:  Negative for chest pain.      Objective:     BP 130/60 (BP Location: Left Arm, Patient Position:  Sitting, Cuff Size: Normal)   Pulse 85   Temp 98.2 F (36.8 C) (Oral)   Ht '5\' 7"'$  (1.702 m)   Wt 165 lb 14.4 oz (75.3 kg)   SpO2 96%   BMI 25.98 kg/m    Physical Exam Vitals reviewed.  Constitutional:      Appearance: Normal appearance.  HENT:     Right Ear: Tympanic membrane normal.     Left Ear: Tympanic membrane normal.     Mouth/Throat:     Mouth: Mucous membranes are moist.     Pharynx: Oropharynx is clear. No oropharyngeal exudate or posterior oropharyngeal erythema.  Cardiovascular:     Rate and Rhythm: Normal rate and regular rhythm.  Pulmonary:     Effort: Pulmonary effort is normal.     Breath sounds: Normal breath sounds. No wheezing or rales.  Musculoskeletal:     Cervical back: Neck supple.  Lymphadenopathy:     Cervical: No cervical adenopathy.  Neurological:     Mental Status: He is alert.      No results found for any visits on 02/26/22.    The 10-year ASCVD risk score (Arnett DK, et al., 2019) is: 4.1%    Assessment & Plan:   Problem List Items Addressed This Visit   None Visit Diagnoses  Acute bronchitis, unspecified organism    -  Primary     Suspect acute viral bronchitis.  Unremarkable exam.  No fever. -Recommend symptomatic treatment with Mucinex 1200 g twice daily and good hydration. -Follow-up promptly for any fever, increased shortness of breath, or other concerns.  No follow-ups on file.    Carolann Littler, MD

## 2022-02-26 NOTE — Patient Instructions (Signed)
Consider plain Mucinex 1,200 mg by mouth twice daily.    Follow up for any fever or increased shortness of breath.

## 2022-06-16 ENCOUNTER — Telehealth (INDEPENDENT_AMBULATORY_CARE_PROVIDER_SITE_OTHER): Payer: No Typology Code available for payment source | Admitting: Family Medicine

## 2022-06-16 ENCOUNTER — Encounter: Payer: Self-pay | Admitting: Family Medicine

## 2022-06-16 VITALS — Ht 67.0 in | Wt 165.9 lb

## 2022-06-16 DIAGNOSIS — R5383 Other fatigue: Secondary | ICD-10-CM

## 2022-06-16 DIAGNOSIS — G8929 Other chronic pain: Secondary | ICD-10-CM | POA: Diagnosis not present

## 2022-06-16 DIAGNOSIS — M545 Low back pain, unspecified: Secondary | ICD-10-CM

## 2022-06-16 NOTE — Progress Notes (Signed)
Patient ID: Neil Cox, male   DOB: [birth year], 56 y.o.   MRN: [id number]   Virtual Visit via Telephone Note  I connected with Neil Cox on 06/16/22 at  3:45 PM EST by telephone and verified that I am speaking with the correct person using two identifiers.   I discussed the limitations, risks, security and privacy concerns of performing an evaluation and management service by telephone and the availability of in person appointments. I also discussed with the patient that there may be a patient responsible charge related to this service. The patient expressed understanding and agreed to proceed.  Location patient: home Location provider: work or home office Participants present for the call: patient, provider Patient did not have a visit in the prior 7 days to address this/these issue(s).   History of Present Illness: Neil Cox has longstanding history of back difficulties.  He recalls last March lifting a cooler and reinjured his back.  He has had some gradual slow improvement and is about 50% better overall but still has daily symptoms.  Takes very little to get this aggravated.  For example, just getting in and out of a car aggravates his back pain.  He works as a Community education officer.  He feels that his current pain is discogenic.  Occasional paresthesias lower extremities but no lower extremity weakness.  He does have some secondary muscle spasm.  No major radiculitis pains.  He is more frustrated with the day-to-day lack of ability to do things he used to do.  He does have obstructive sleep apnea and uses CPAP regularly but still feels fatigued frequently.  He had seen several podcasts recently from a physiatrist discussing various entities that can slow down optimal healing.  He is fairly diligent with diet and has taken some supplements regarding zinc and vitamin C.  He queries whether he could have any hormone levels that are off such as testosterone.  His thyroid hormones have been normal  in the past.  Past Medical History:  Diagnosis Date   Allergy    Arthritis    Cancer ([place]) 2001   right thumb cancer/squamous cell   DDD (degenerative disc disease), lumbosacral    L3-L4   GERD (gastroesophageal reflux disease)    Hypertension    in past   Past Surgical History:  Procedure Laterality Date   EXCISION MORTON'S NEUROMA     30 years ago   thumb surg     had cancer right thumb   UPPER GASTROINTESTINAL ENDOSCOPY     occasional GERD/no meds    reports that he has never smoked. He quit smokeless tobacco use about 22 years ago.  His smokeless tobacco use included chew. He reports that he does not drink alcohol and does not use drugs. family history includes Arthritis in his father and paternal grandmother; Diabetes in his brother and father; Hypertension in his paternal grandmother; Osteoarthritis in his mother. No Known Allergies     Observations/Objective: Patient sounds cheerful and well on the phone. I do not appreciate any SOB. Speech and thought processing are grossly intact. Patient reported vitals:  Assessment and Plan:  #1 chronic discogenic low back pain.  He is done extensive exercises and physical therapy with about 50% improvement.  He is reluctant to consider surgical or steroid injection options  #2 fatigue.  History of known obstructive sleep apnea.  He had excessive daytime fatigue daily in spite of CPAP.  -Check further labs with CBC, CMP, TSH, 25-hydroxy vitamin D, total testosterone,  and cortisol -Patient will schedule for future labs this week for the above test  Follow Up Instructions:   99441 5-10 99442 11-20 99443 21-30 I did not refer this patient for an OV in the next 24 hours for this/these issue(s).  I discussed the assessment and treatment plan with the patient. The patient was provided an opportunity to ask questions and all were answered. The patient agreed with the plan and demonstrated an understanding of the instructions.    The patient was advised to call back or seek an in-person evaluation if the symptoms worsen or if the condition fails to improve as anticipated.  I provided 22 minutes of non-face-to-face time during this encounter.   Carolann Littler, MD

## 2022-06-18 ENCOUNTER — Encounter: Payer: Self-pay | Admitting: Family Medicine

## 2022-06-18 ENCOUNTER — Other Ambulatory Visit (INDEPENDENT_AMBULATORY_CARE_PROVIDER_SITE_OTHER): Payer: No Typology Code available for payment source

## 2022-06-18 DIAGNOSIS — R5383 Other fatigue: Secondary | ICD-10-CM | POA: Diagnosis not present

## 2022-06-18 DIAGNOSIS — R7989 Other specified abnormal findings of blood chemistry: Secondary | ICD-10-CM

## 2022-06-18 LAB — CBC WITH DIFFERENTIAL/PLATELET
Basophils Absolute: 0 10*3/uL (ref 0.0–0.1)
Basophils Relative: 0.5 % (ref 0.0–3.0)
Eosinophils Absolute: 0 10*3/uL (ref 0.0–0.7)
Eosinophils Relative: 0.9 % (ref 0.0–5.0)
HCT: 43.5 % (ref 39.0–52.0)
Hemoglobin: 14.8 g/dL (ref 13.0–17.0)
Lymphocytes Relative: 39.3 % (ref 12.0–46.0)
Lymphs Abs: 1.3 10*3/uL (ref 0.7–4.0)
MCHC: 33.9 g/dL (ref 30.0–36.0)
MCV: 88.3 fl (ref 78.0–100.0)
Monocytes Absolute: 0.2 10*3/uL (ref 0.1–1.0)
Monocytes Relative: 7 % (ref 3.0–12.0)
Neutro Abs: 1.8 10*3/uL (ref 1.4–7.7)
Neutrophils Relative %: 52.3 % (ref 43.0–77.0)
Platelets: 203 10*3/uL (ref 150.0–400.0)
RBC: 4.93 Mil/uL (ref 4.22–5.81)
RDW: 13.7 % (ref 11.5–15.5)
WBC: 3.4 10*3/uL — ABNORMAL LOW (ref 4.0–10.5)

## 2022-06-18 LAB — VITAMIN D 25 HYDROXY (VIT D DEFICIENCY, FRACTURES): VITD: 37.58 ng/mL (ref 30.00–100.00)

## 2022-06-18 LAB — COMPREHENSIVE METABOLIC PANEL
ALT: 15 U/L (ref 0–53)
AST: 17 U/L (ref 0–37)
Albumin: 4.7 g/dL (ref 3.5–5.2)
Alkaline Phosphatase: 39 U/L (ref 39–117)
BUN: 22 mg/dL (ref 6–23)
CO2: 29 mEq/L (ref 19–32)
Calcium: 9.5 mg/dL (ref 8.4–10.5)
Chloride: 99 mEq/L (ref 96–112)
Creatinine, Ser: 0.88 mg/dL (ref 0.40–1.50)
GFR: 96.86 mL/min (ref >60.00)
Glucose, Bld: 118 mg/dL — ABNORMAL HIGH (ref 70–99)
Potassium: 4.5 mEq/L (ref 3.5–5.1)
Sodium: 135 mEq/L (ref 135–145)
Total Bilirubin: 0.6 mg/dL (ref 0.2–1.2)
Total Protein: 7.3 g/dL (ref 6.0–8.3)

## 2022-06-18 LAB — CORTISOL: Cortisol, Plasma: 17.1 ug/dL

## 2022-06-18 LAB — TESTOSTERONE: Testosterone: 290.19 ng/dL — ABNORMAL LOW (ref 300.00–890.00)

## 2022-06-18 LAB — TSH: TSH: 0.69 u[IU]/mL (ref 0.35–5.50)

## 2022-06-19 ENCOUNTER — Telehealth: Payer: Self-pay | Admitting: Family Medicine

## 2022-06-19 NOTE — Telephone Encounter (Signed)
See results note. 

## 2022-06-19 NOTE — Telephone Encounter (Signed)
Pt is returning Neil Cox call and would like blood work results

## 2022-07-07 ENCOUNTER — Telehealth: Payer: Self-pay | Admitting: Family Medicine

## 2022-07-07 ENCOUNTER — Other Ambulatory Visit: Payer: No Typology Code available for payment source

## 2022-07-07 DIAGNOSIS — R7989 Other specified abnormal findings of blood chemistry: Secondary | ICD-10-CM

## 2022-07-07 LAB — FOLLICLE STIMULATING HORMONE: FSH: 12.7 m[IU]/mL (ref 1.4–18.1)

## 2022-07-07 LAB — TESTOSTERONE: Testosterone: 382.7 ng/dL (ref 300.00–890.00)

## 2022-07-07 LAB — LUTEINIZING HORMONE: LH: 3.54 m[IU]/mL (ref 1.50–9.30)

## 2022-07-07 NOTE — Telephone Encounter (Signed)
See result note.  

## 2022-07-07 NOTE — Telephone Encounter (Signed)
Pt called, returning CMA's call. CMA was unavailable. Pt asked that CMA call back at their earliest convenience. 

## 2022-07-08 LAB — PROLACTIN: Prolactin: 4.9 ng/mL (ref 2.0–18.0)

## 2022-07-14 ENCOUNTER — Telehealth (INDEPENDENT_AMBULATORY_CARE_PROVIDER_SITE_OTHER): Payer: No Typology Code available for payment source | Admitting: Family Medicine

## 2022-07-14 ENCOUNTER — Other Ambulatory Visit (HOSPITAL_COMMUNITY): Payer: Self-pay

## 2022-07-14 ENCOUNTER — Encounter: Payer: Self-pay | Admitting: Family Medicine

## 2022-07-14 VITALS — Ht 67.0 in | Wt 165.9 lb

## 2022-07-14 DIAGNOSIS — R7989 Other specified abnormal findings of blood chemistry: Secondary | ICD-10-CM | POA: Diagnosis not present

## 2022-07-14 MED ORDER — TESTOSTERONE CYPIONATE 200 MG/ML IM SOLN
200.0000 mg | INTRAMUSCULAR | 0 refills | Status: DC
  Filled 2022-07-14: qty 6, 84d supply, fill #0

## 2022-07-14 MED ORDER — TESTOSTERONE CYPIONATE 200 MG/ML IM SOLN
200.0000 mg | INTRAMUSCULAR | 0 refills | Status: DC
  Filled 2022-07-14: qty 10, 140d supply, fill #0

## 2022-07-14 NOTE — Progress Notes (Signed)
Patient ID: Neil Cox, male   DOB: [birth year], 56 y.o.   MRN: [id number]   Virtual Visit via Telephone Note  I connected with Neil Cox on 07/14/22 at  3:15 PM EST by telephone and verified that I am speaking with the correct person using two identifiers.   I discussed the limitations, risks, security and privacy concerns of performing an evaluation and management service by telephone and the availability of in person appointments. I also discussed with the patient that there may be a patient responsible charge related to this service. The patient expressed understanding and agreed to proceed.  Location patient: home Location provider: work or home office Participants present for the call: patient, provider Patient did not have a visit in the prior 7 days to address this/these issue(s).   History of Present Illness:  Neil Cox called to discuss recent testosterone levels.  He had some issues with fatigue and slow recovery following activities Also difficulty with chronic back pain and has concerns that he had endocrine issue slowing recovery.  We did do a early morning testosterone level which came back low 290 but subsequent level was 382.  FSH and LH were normal.  Recent cortisol and TSH were normal.  He had called back basically requesting whether he could go on testosterone replacement.  We explained that without 2 consecutive lows frequently insurance would not cover that.  He does not have any contraindications such as history of liver disease, cardiovascular disease, coagulopathy, etc.  Last PSA was 2021.  Past Medical History:  Diagnosis Date   Allergy    Arthritis    Cancer ([place]) 2001   right thumb cancer/squamous cell   DDD (degenerative disc disease), lumbosacral    L3-L4   GERD (gastroesophageal reflux disease)    Hypertension    in past   Past Surgical History:  Procedure Laterality Date   EXCISION MORTON'S NEUROMA     30 years ago   thumb surg     had cancer  right thumb   UPPER GASTROINTESTINAL ENDOSCOPY     occasional GERD/no meds    reports that he has never smoked. He quit smokeless tobacco use about 22 years ago.  His smokeless tobacco use included chew. He reports that he does not drink alcohol and does not use drugs. family history includes Arthritis in his father and paternal grandmother; Diabetes in his brother and father; Hypertension in his paternal grandmother; Osteoarthritis in his mother. No Known Allergies    Observations/Objective: Patient sounds cheerful and well on the phone. I do not appreciate any SOB. Speech and thought processing are grossly intact. Patient reported vitals:  Assessment and Plan:  Fatigue.  Recent low testosterone level as above.  -We had a fairly long discussion regarding pros and cons of replacement therapy.  We explained potential risk of testosterone including blood clots, cardiovascular disease concerns, polycythemia.  He does not have any known contraindications.  We explained that if we go on replacement therapy would need to monitor regularly CBC and PSA  -Future lab for PSA, CBC, total testosterone level -Sent prescription for testosterone cypionate 200 mg/mL 1 cc every 14 days.  He will pick up prescription then set up nurse visit here for initial injection.  His goal is to do home injections.  We will need to get follow-up labs again as above in 2 months  Follow Up Instructions:   23557 5-10 99442 11-20 99443 21-30 I did not refer this patient for an OV in the  next 24 hours for this/these issue(s).  I discussed the assessment and treatment plan with the patient. The patient was provided an opportunity to ask questions and all were answered. The patient agreed with the plan and demonstrated an understanding of the instructions.   The patient was advised to call back or seek an in-person evaluation if the symptoms worsen or if the condition fails to improve as anticipated.  I provided 23  minutes of non-face-to-face time during this encounter.   Carolann Littler, MD

## 2022-07-21 ENCOUNTER — Ambulatory Visit (INDEPENDENT_AMBULATORY_CARE_PROVIDER_SITE_OTHER): Payer: No Typology Code available for payment source | Admitting: *Deleted

## 2022-07-21 DIAGNOSIS — R7989 Other specified abnormal findings of blood chemistry: Secondary | ICD-10-CM

## 2022-07-21 MED ORDER — TESTOSTERONE CYPIONATE 200 MG/ML IM SOLN
200.0000 mg | INTRAMUSCULAR | Status: AC
  Administered 2022-07-21: 200 mg via INTRAMUSCULAR

## 2022-07-21 NOTE — Progress Notes (Signed)
Per orders of Dr. Elease Hashimoto, injection of Testosterone given by Westley Hummer. Patient tolerated injection well.

## 2022-07-25 ENCOUNTER — Telehealth: Payer: Self-pay | Admitting: Family Medicine

## 2022-07-25 NOTE — Telephone Encounter (Signed)
Pt is calling and would like new rx for syringes for testosterone medication  [facility] Phone: [phone]  Fax: [phone]

## 2022-07-28 ENCOUNTER — Other Ambulatory Visit (HOSPITAL_COMMUNITY): Payer: Self-pay

## 2022-07-28 MED ORDER — "BD DISP NEEDLES 22G X 1-1/2"" MISC"
0 refills | Status: DC
  Filled 2022-07-28 – 2023-07-13 (×3): qty 10, 10d supply, fill #0

## 2022-07-28 MED ORDER — BD SYRINGE LUER-LOK 3 ML MISC
0 refills | Status: DC
  Filled 2022-07-28 – 2023-07-13 (×3): qty 10, 10d supply, fill #0

## 2022-07-28 NOTE — Telephone Encounter (Signed)
Rx sent 

## 2022-07-28 NOTE — Addendum Note (Signed)
Addended by: Nilda Riggs on: 07/28/2022 09:57 AM   Modules accepted: Orders

## 2022-07-30 ENCOUNTER — Other Ambulatory Visit (HOSPITAL_COMMUNITY): Payer: Self-pay

## 2022-08-27 ENCOUNTER — Ambulatory Visit: Payer: No Typology Code available for payment source | Admitting: Primary Care

## 2022-09-22 ENCOUNTER — Ambulatory Visit: Payer: Self-pay | Admitting: Family Medicine

## 2022-09-29 ENCOUNTER — Encounter: Payer: Self-pay | Admitting: Family Medicine

## 2022-09-29 ENCOUNTER — Ambulatory Visit (INDEPENDENT_AMBULATORY_CARE_PROVIDER_SITE_OTHER): Payer: 59 | Admitting: Family Medicine

## 2022-09-29 DIAGNOSIS — R7989 Other specified abnormal findings of blood chemistry: Secondary | ICD-10-CM

## 2022-09-29 LAB — CBC WITH DIFFERENTIAL/PLATELET
Basophils Absolute: 0 10*3/uL (ref 0.0–0.1)
Basophils Relative: 0.4 % (ref 0.0–3.0)
Eosinophils Absolute: 0 10*3/uL (ref 0.0–0.7)
Eosinophils Relative: 0.5 % (ref 0.0–5.0)
HCT: 43.9 % (ref 39.0–52.0)
Hemoglobin: 14.7 g/dL (ref 13.0–17.0)
Lymphocytes Relative: 35.3 % (ref 12.0–46.0)
Lymphs Abs: 1.6 10*3/uL (ref 0.7–4.0)
MCHC: 33.5 g/dL (ref 30.0–36.0)
MCV: 90.4 fl (ref 78.0–100.0)
Monocytes Absolute: 0.3 10*3/uL (ref 0.1–1.0)
Monocytes Relative: 7.5 % (ref 3.0–12.0)
Neutro Abs: 2.5 10*3/uL (ref 1.4–7.7)
Neutrophils Relative %: 56.3 % (ref 43.0–77.0)
Platelets: 220 10*3/uL (ref 150.0–400.0)
RBC: 4.86 Mil/uL (ref 4.22–5.81)
RDW: 13.5 % (ref 11.5–15.5)
WBC: 4.4 10*3/uL (ref 4.0–10.5)

## 2022-09-29 LAB — TESTOSTERONE: Testosterone: 632.96 ng/dL (ref 300.00–890.00)

## 2022-09-29 LAB — PSA: PSA: 0.36 ng/mL (ref 0.10–4.00)

## 2022-09-29 NOTE — Progress Notes (Signed)
Established Patient Office Visit  Subjective   Patient ID: Neil Cox, male    DOB: 1966-08-03  Age: 57 y.o. MRN: [id number]  Chief Complaint  Patient presents with   Medical Management of Chronic Issues    HPI   Neil Cox is seen for follow-up after recent initiation of testosterone therapy.  He had some recent poor sleep quality which he thinks may be affecting energy but overall feels slightly better since starting testosterone 200 mg every 2 weeks.  His last dose was about 2 weeks ago.  We had advised follow-up now for labs including PSA, CBC, and follow-up total testosterone level.  He denies any significant adverse symptoms with the testosterone therapy.  He states he generally feels better right after injection and starts to loose some affect near the end of the second week  Past Medical History:  Diagnosis Date   Allergy    Arthritis    Cancer ([place]) 2001   right thumb cancer/squamous cell   DDD (degenerative disc disease), lumbosacral    L3-L4   GERD (gastroesophageal reflux disease)    Hypertension    in past   Past Surgical History:  Procedure Laterality Date   EXCISION MORTON'S NEUROMA     30 years ago   thumb surg     had cancer right thumb   UPPER GASTROINTESTINAL ENDOSCOPY     occasional GERD/no meds    reports that he has never smoked. He quit smokeless tobacco use about 23 years ago.  His smokeless tobacco use included chew. He reports that he does not drink alcohol and does not use drugs. family history includes Arthritis in his father and paternal grandmother; Diabetes in his brother and father; Hypertension in his paternal grandmother; Osteoarthritis in his mother. No Known Allergies  Review of Systems  Eyes:  Negative for blurred vision.  Respiratory:  Negative for shortness of breath.   Cardiovascular:  Negative for chest pain.  Neurological:  Negative for dizziness, weakness and headaches.      Objective:     BP 110/70 (BP Location: Left Arm,  Patient Position: Sitting, Cuff Size: Large)   Pulse 74   Temp 97.6 F (36.4 C) (Oral)   Ht 5' 7"$  (1.702 m)   Wt 172 lb 4.8 oz (78.2 kg)   SpO2 98%   BMI 26.99 kg/m  BP Readings from Last 3 Encounters:  09/29/22 110/70  02/26/22 130/60  02/19/22 118/66   Wt Readings from Last 3 Encounters:  09/29/22 172 lb 4.8 oz (78.2 kg)  07/14/22 165 lb 14.4 oz (75.3 kg)  06/16/22 165 lb 14.4 oz (75.3 kg)      Physical Exam Vitals reviewed.  Constitutional:      Appearance: He is well-developed.  Eyes:     Pupils: Pupils are equal, round, and reactive to light.  Neck:     Thyroid: No thyromegaly.  Cardiovascular:     Rate and Rhythm: Normal rate and regular rhythm.  Pulmonary:     Effort: Pulmonary effort is normal. No respiratory distress.     Breath sounds: Normal breath sounds. No wheezing or rales.  Musculoskeletal:     Cervical back: Neck supple.  Neurological:     Mental Status: He is alert and oriented to person, place, and time.      No results found for any visits on 09/29/22.    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  Patient currently on testosterone replacement 200 mg every 2 weeks. -Recheck labs today with CBC, PSA, total testosterone level -Adjust dosage accordingly based on labs -If levels are still low consider going to every 10-day interval for injections versus 14 days   No follow-ups on file.    Carolann Littler, MD

## 2022-10-01 ENCOUNTER — Other Ambulatory Visit (HOSPITAL_COMMUNITY): Payer: Self-pay

## 2022-10-01 MED ORDER — TESTOSTERONE CYPIONATE 200 MG/ML IM SOLN
200.0000 mg | INTRAMUSCULAR | 0 refills | Status: DC
  Filled 2022-10-01: qty 6, 84d supply, fill #0
  Filled 2022-10-01: qty 10, 14d supply, fill #0
  Filled 2022-11-21 – 2022-12-31 (×2): qty 4, 56d supply, fill #1

## 2022-10-01 NOTE — Addendum Note (Signed)
Addended by: Eulas Post on: 10/01/2022 12:59 PM   Modules accepted: Orders

## 2022-10-01 NOTE — Addendum Note (Signed)
Addended by: Nilda Riggs on: 10/01/2022 09:13 AM   Modules accepted: Orders

## 2022-10-08 ENCOUNTER — Other Ambulatory Visit (HOSPITAL_COMMUNITY): Payer: Self-pay

## 2022-11-21 ENCOUNTER — Telehealth (INDEPENDENT_AMBULATORY_CARE_PROVIDER_SITE_OTHER): Payer: 59 | Admitting: Adult Health

## 2022-11-21 ENCOUNTER — Other Ambulatory Visit (HOSPITAL_COMMUNITY): Payer: Self-pay

## 2022-11-21 ENCOUNTER — Encounter: Payer: Self-pay | Admitting: Adult Health

## 2022-11-21 VITALS — Ht 67.0 in | Wt 168.0 lb

## 2022-11-21 DIAGNOSIS — R7989 Other specified abnormal findings of blood chemistry: Secondary | ICD-10-CM

## 2022-11-21 NOTE — Progress Notes (Signed)
Virtual Visit via Video Note  I connected with Neil Cox  on 11/21/22 at  2:30 PM EDT by a video enabled telemedicine application and verified that I am speaking with the correct person using two identifiers.  Location patient: home Location provider:work or home office Persons participating in the virtual visit: patient, provider  I discussed the limitations of evaluation and management by telemedicine and the availability of in person appointments. The patient expressed understanding and agreed to proceed.   HPI: 57 year old male who  has a past medical history of Allergy, Arthritis, Cancer (2001), DDD (degenerative disc disease), lumbosacral, GERD (gastroesophageal reflux disease), and Hypertension.  He is patient of Dr. Caryl Never who I am seeing today for low testosterone.  He was last seen by his PCP in February 2024 in which time he was feeling slightly better overall since being testosterone therapy at 200 mg every 14 days.  He reported that he generally felt better right after the injection and it started to lose some affect near the end of his second week.  At this time his testosterone level was 632.96, up from 382.7.  He was kept on his current dose and advised follow-up with CBC and testosterone level in 6 months.  Today he reports that he continues to get extreme fatigue towards the end of the 14 days. He would like to try something else to see if he can keep his fatigued level up.      ROS: See pertinent positives and negatives per HPI.  Past Medical History:  Diagnosis Date   Allergy    Arthritis    Cancer 2001   right thumb cancer/squamous cell   DDD (degenerative disc disease), lumbosacral    L3-L4   GERD (gastroesophageal reflux disease)    Hypertension    in past    Past Surgical History:  Procedure Laterality Date   EXCISION MORTON'S NEUROMA     30 years ago   thumb surg     had cancer right thumb   UPPER GASTROINTESTINAL ENDOSCOPY     occasional  GERD/no meds    Family History  Problem Relation Age of Onset   Arthritis Father    Diabetes Father    Osteoarthritis Mother    Arthritis Paternal Grandmother    Hypertension Paternal Grandmother    Diabetes Brother    Colon cancer Neg Hx    Stomach cancer Neg Hx    Rectal cancer Neg Hx        Current Outpatient Medications:    Ascorbic Acid (VITA-C PO), Take 500 mg by mouth daily., Disp: , Rfl:    cholecalciferol (VITAMIN D) 1000 units tablet, Take 5,000 Units by mouth daily., Disp: , Rfl:    Multiple Vitamin (ONE-A-DAY MENS PO), Take by mouth daily., Disp: , Rfl:    naproxen sodium (ALEVE) 220 MG tablet, Take 220 mg by mouth as needed., Disp: , Rfl:    NEEDLE, DISP, 22 G (BD DISP NEEDLES) 22G X 1-1/2" MISC, Use as directed, Disp: 10 each, Rfl: 0   Syringe, Disposable, (B-D SYRINGE LUER-LOK 3CC) 3 ML MISC, Use as directed, Disp: 10 each, Rfl: 0   testosterone cypionate (DEPOTESTOSTERONE CYPIONATE) 200 MG/ML injection, Inject 1 mL (200 mg total) into the muscle every 14 (fourteen) days., Disp: 10 mL, Rfl: 0  Current Facility-Administered Medications:    0.9 %  sodium chloride infusion, 500 mL, Intravenous, Once, Pyrtle, Carie Caddy, MD   testosterone cypionate (DEPOTESTOSTERONE CYPIONATE) injection 200 mg, 200 mg,  Intramuscular, Q14 Days, Burchette, Elberta Fortis, MD, 200 mg at 07/21/22 0945  EXAM:  VITALS per patient if applicable:  GENERAL: alert, oriented, appears well and in no acute distress  HEENT: atraumatic, conjunttiva clear, no obvious abnormalities on inspection of external nose and ears  NECK: normal movements of the head and neck  LUNGS: on inspection no signs of respiratory distress, breathing rate appears normal, no obvious gross SOB, gasping or wheezing  CV: no obvious cyanosis  MS: moves all visible extremities without noticeable abnormality  PSYCH/NEURO: pleasant and cooperative, no obvious depression or anxiety, speech and thought processing grossly  intact  ASSESSMENT AND PLAN:  Discussed the following assessment and plan:  1. Low testosterone in male - Shared decision making we decided to try having him to 100 mg weekly of Testosterone. He will follow up in 3 weeks to check testosterone and CBC  - Testosterone; Future - CBC with Differential/Platelet; Future      I discussed the assessment and treatment plan with the patient. The patient was provided an opportunity to ask questions and all were answered. The patient agreed with the plan and demonstrated an understanding of the instructions.   The patient was advised to call back or seek an in-person evaluation if the symptoms worsen or if the condition fails to improve as anticipated.   Shirline Frees, NP

## 2022-11-26 ENCOUNTER — Other Ambulatory Visit (HOSPITAL_COMMUNITY): Payer: Self-pay

## 2022-12-01 DIAGNOSIS — L578 Other skin changes due to chronic exposure to nonionizing radiation: Secondary | ICD-10-CM | POA: Diagnosis not present

## 2022-12-01 DIAGNOSIS — Z85828 Personal history of other malignant neoplasm of skin: Secondary | ICD-10-CM | POA: Diagnosis not present

## 2022-12-01 DIAGNOSIS — L814 Other melanin hyperpigmentation: Secondary | ICD-10-CM | POA: Diagnosis not present

## 2022-12-01 DIAGNOSIS — L570 Actinic keratosis: Secondary | ICD-10-CM | POA: Diagnosis not present

## 2022-12-01 DIAGNOSIS — L821 Other seborrheic keratosis: Secondary | ICD-10-CM | POA: Diagnosis not present

## 2022-12-01 DIAGNOSIS — D485 Neoplasm of uncertain behavior of skin: Secondary | ICD-10-CM | POA: Diagnosis not present

## 2022-12-16 ENCOUNTER — Encounter: Payer: Self-pay | Admitting: Internal Medicine

## 2022-12-31 ENCOUNTER — Other Ambulatory Visit (INDEPENDENT_AMBULATORY_CARE_PROVIDER_SITE_OTHER): Payer: 59

## 2022-12-31 ENCOUNTER — Other Ambulatory Visit (HOSPITAL_COMMUNITY): Payer: Self-pay

## 2022-12-31 DIAGNOSIS — R7989 Other specified abnormal findings of blood chemistry: Secondary | ICD-10-CM | POA: Diagnosis not present

## 2022-12-31 LAB — CBC WITH DIFFERENTIAL/PLATELET
Basophils Absolute: 0 10*3/uL (ref 0.0–0.1)
Basophils Relative: 0.6 % (ref 0.0–3.0)
Eosinophils Absolute: 0 10*3/uL (ref 0.0–0.7)
Eosinophils Relative: 0.9 % (ref 0.0–5.0)
HCT: 44.6 % (ref 39.0–52.0)
Hemoglobin: 14.7 g/dL (ref 13.0–17.0)
Lymphocytes Relative: 33 % (ref 12.0–46.0)
Lymphs Abs: 1.3 10*3/uL (ref 0.7–4.0)
MCHC: 33 g/dL (ref 30.0–36.0)
MCV: 86.8 fl (ref 78.0–100.0)
Monocytes Absolute: 0.3 10*3/uL (ref 0.1–1.0)
Monocytes Relative: 8.3 % (ref 3.0–12.0)
Neutro Abs: 2.2 10*3/uL (ref 1.4–7.7)
Neutrophils Relative %: 57.2 % (ref 43.0–77.0)
Platelets: 188 10*3/uL (ref 150.0–400.0)
RBC: 5.14 Mil/uL (ref 4.22–5.81)
RDW: 16.1 % — ABNORMAL HIGH (ref 11.5–15.5)
WBC: 3.9 10*3/uL — ABNORMAL LOW (ref 4.0–10.5)

## 2022-12-31 LAB — TESTOSTERONE: Testosterone: 655.95 ng/dL (ref 300.00–890.00)

## 2023-01-23 ENCOUNTER — Encounter (HOSPITAL_COMMUNITY): Payer: Self-pay

## 2023-01-23 ENCOUNTER — Ambulatory Visit (HOSPITAL_COMMUNITY)
Admission: EM | Admit: 2023-01-23 | Discharge: 2023-01-23 | Disposition: A | Discharge status: Home / Self Care | Payer: 59 | Attending: Emergency Medicine | Admitting: Emergency Medicine

## 2023-01-23 DIAGNOSIS — J069 Acute upper respiratory infection, unspecified: Secondary | ICD-10-CM | POA: Diagnosis not present

## 2023-01-23 MED ORDER — METHYLPREDNISOLONE SODIUM SUCC 125 MG IJ SOLR
INTRAMUSCULAR | Status: AC
  Filled 2023-01-23: qty 2

## 2023-01-23 MED ORDER — PREDNISONE 10 MG (21) PO TBPK: ORAL_TABLET | ORAL | 0 refills | Status: DC

## 2023-01-23 MED ORDER — METHYLPREDNISOLONE SODIUM SUCC 125 MG IJ SOLR
80.0000 mg | Freq: Once | INTRAMUSCULAR | Status: AC
  Administered 2023-01-23: 80 mg via INTRAMUSCULAR

## 2023-01-23 MED ORDER — AZITHROMYCIN 250 MG PO TABS: 250.0000 mg | ORAL_TABLET | Freq: Every day | ORAL | 0 refills | Status: DC

## 2023-01-23 MED ORDER — PROMETHAZINE-DM 6.25-15 MG/5ML PO SYRP: 5.0000 mL | ORAL_SOLUTION | Freq: Four times a day (QID) | ORAL | 0 refills | Status: DC | PRN

## 2023-01-23 NOTE — Discharge Instructions (Addendum)
You have a upper respiratory tract infection.  Due to the extent of your wheezing, I am placing you on steroids.  Please take the Dosepak as prescribed with breakfast.  Please also take the antibiotics as prescribed and until finished, to cover for bacterial etiology.  And take the cough syrup as needed, do not drink or drive on this medication as it may make you drowsy.  You can continue with the expectorant, Mucinex 1200 mg daily.  Please ensure you are drinking at least 64 ounces of water.  To help loosen your secretions you can sleep with a humidifier, do warm saline gargles, and tea with honey.  Please return to the clinic or follow-up with your primary care provider if no improvement over symptoms over the next 5 days despite antibiotics.  Please seek immediate care if you develop shortness of breath, high fever despite medication, chest pain, or any new concerning symptoms.

## 2023-01-23 NOTE — ED Provider Notes (Signed)
[facility]    CSN: [id number] Arrival date & time: 01/23/23  1815      History   Chief Complaint Chief Complaint  Patient presents with   URI    HPI Neil Cox is a 57 y.o. male.   Patient presents to clinic for complaints of cough, chest congestion, sinus pain and pressure and wheezing for the past 4 days.  He has been taken over-the-counter cold and flu medications without much relief.  Taking Sudafed and antihistamine.  Reports cough is productive with yellow phlegm.  Denies shortness of breath, chest pain or fever.  Denies any history of respiratory diseases like asthma.  Does endorse body aches.  Took a home COVID-19 test that was negative.  The history is provided by the patient and medical records.  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 Authorizing Provider  azithromycin (ZITHROMAX) 250 MG tablet Take 1 tablet (250 mg total) by mouth  daily. Take first 2 tablets together, then 1 every day until finished. 01/23/23  Yes Rinaldo Ratel, [place] N, FNP  predniSONE (STERAPRED UNI-PAK 21 TAB) 10 MG (21) TBPK tablet Take as prescribed. 01/23/23  Yes Rinaldo Ratel, [place] N, FNP  promethazine-dextromethorphan (PROMETHAZINE-DM) 6.25-15 MG/5ML syrup Take 5 mLs by mouth 4 (four) times daily as needed for cough. 01/23/23  Yes Rinaldo Ratel, [place] N, FNP  Ascorbic Acid (VITA-C PO) Take 500 mg by mouth daily.    [provider]  cholecalciferol (VITAMIN D) 1000 units tablet Take 5,000 Units by mouth daily.    [provider]  Multiple Vitamin (ONE-A-DAY MENS PO) Take by mouth daily.    [provider]  naproxen sodium (ALEVE) 220 MG tablet Take 220 mg by mouth as needed.    [provider]  NEEDLE, DISP, 22 G (BD DISP NEEDLES) 22G X 1-1/2" MISC Use as directed 07/28/22   Burchette, Elberta Fortis, MD  Syringe, Disposable, (B-D SYRINGE LUER-LOK 3CC) 3 ML MISC Use as directed 07/28/22   Burchette, Elberta Fortis, MD  testosterone cypionate (DEPOTESTOSTERONE CYPIONATE) 200 MG/ML injection Inject 1 mL (200 mg total) into the muscle every 14 (fourteen) days. 10/01/22   Burchette, Elberta Fortis, MD    Family History Family History  Problem Relation Age of Onset   Arthritis Father    Diabetes Father  Osteoarthritis Mother    Arthritis Paternal Grandmother    Hypertension Paternal Grandmother    Diabetes Brother    Colon cancer Neg Hx    Stomach cancer Neg Hx    Rectal cancer Neg Hx     Social History Social History   Tobacco Use   Smoking status: Never   Smokeless tobacco: Former    Types: Chew    Quit date: 09/03/1999  Vaping Use   Vaping Use: Never used  Substance Use Topics   Alcohol use: No   Drug use: No     Allergies   Patient has no known allergies.   Review of Systems Review of Systems  Constitutional:  Positive for fatigue.  HENT:  Positive for congestion, sinus pressure, sinus pain and sore throat.    Respiratory:  Positive for cough and wheezing. Negative for shortness of breath.   Cardiovascular:  Negative for chest pain.     Physical Exam Triage Vital Signs ED Triage Vitals  Enc Vitals Group     BP 01/23/23 1913 (!) 149/78     Pulse Rate 01/23/23 1913 83     Resp 01/23/23 1913 16     Temp 01/23/23 1913 98 F (36.7 C)     Temp Source 01/23/23 1913 Oral     SpO2 01/23/23 1913 98 %     Weight --      Height --      Head Circumference --      Peak Flow --      Pain Score 01/23/23 1912 6     Pain Loc --      Pain Edu? --      Excl. in GC? --    No data found.  Updated Vital Signs BP (!) 149/78   Pulse 83   Temp 98 F (36.7 C) (Oral)   Resp 16   SpO2 98%   Visual Acuity Right Eye Distance:   Left Eye Distance:   Bilateral Distance:    Right Eye Near:   Left Eye Near:    Bilateral Near:     Physical Exam Vitals and nursing note reviewed.  Constitutional:      Appearance: Normal appearance.  HENT:     Head: Normocephalic and atraumatic.     Right Ear: External ear normal.     Left Ear: External ear normal.     Nose: Congestion present.     Mouth/Throat:     Mouth: Mucous membranes are moist.     Pharynx: Posterior oropharyngeal erythema present.  Eyes:     Conjunctiva/sclera: Conjunctivae normal.  Cardiovascular:     Rate and Rhythm: Normal rate and regular rhythm.     Heart sounds: Normal heart sounds. No murmur heard. Pulmonary:     Effort: Pulmonary effort is normal.     Breath sounds: Wheezing present.  Musculoskeletal:        General: Normal range of motion.     Cervical back: Normal range of motion.  Lymphadenopathy:     Cervical: Cervical adenopathy present.  Skin:    General: Skin is warm and dry.  Neurological:     General: No focal deficit present.     Mental Status: He is alert.  Psychiatric:        Mood and Affect: Mood normal.        Behavior: Behavior is cooperative.      UC Treatments / Results  Labs (all labs ordered  are listed, but only abnormal results  are displayed) Labs Reviewed - No data to display  EKG   Radiology No results found.  Procedures Procedures (including critical care time)  Medications Ordered in UC Medications  methylPREDNISolone sodium succinate (SOLU-MEDROL) 125 mg/2 mL injection 80 mg (has no administration in time range)    Initial Impression / Assessment and Plan / UC Course  I have reviewed the triage vital signs and the nursing notes.  Pertinent labs & imaging results that were available during my care of the patient were reviewed by me and considered in my medical decision making (see chart for details).  Vitals and triage reviewed, patient is hemodynamically stable.  Expiratory wheezing in all lobes.  Oxygen stable at 98% on room air, able to speak in full sentences, no acute distress.  Patient reports sinus pressure and tenderness to palpation.  Will cover for bacterial upper respiratory tract infection with azithromycin.  Given IM steroids and sent home on steroid burst due to the extent of wheezing.  Cough medicine as needed.  Encouraged to follow-up with PCP or return to clinic for any new or concerning symptoms.  Plan of care, follow-up care and return precautions given, no questions at this time.     Final Clinical Impressions(s) / UC Diagnoses   Final diagnoses:  Upper respiratory tract infection, unspecified type     Discharge Instructions      You have a upper respiratory tract infection.  Due to the extent of your wheezing, I am placing you on steroids.  Please take the Dosepak as prescribed with breakfast.  Please also take the antibiotics as prescribed and until finished, to cover for bacterial etiology.  And take the cough syrup as needed, do not drink or drive on this medication as it may make you drowsy.  You can continue with the expectorant, Mucinex 1200 mg daily.  Please ensure you are drinking at least 64 ounces of water.  To help loosen your  secretions you can sleep with a humidifier, do warm saline gargles, and tea with honey.  Please return to the clinic or follow-up with your primary care provider if no improvement over symptoms over the next 5 days despite antibiotics.  Please seek immediate care if you develop shortness of breath, high fever despite medication, chest pain, or any new concerning symptoms.       ED Prescriptions     Medication Sig Dispense Auth. Provider   promethazine-dextromethorphan (PROMETHAZINE-DM) 6.25-15 MG/5ML syrup Take 5 mLs by mouth 4 (four) times daily as needed for cough. 118 mL Rinaldo Ratel, [place] N, FNP   azithromycin (ZITHROMAX) 250 MG tablet Take 1 tablet (250 mg total) by mouth daily. Take first 2 tablets together, then 1 every day until finished. 6 tablet Rinaldo Ratel, [place] N, [place]   predniSONE (STERAPRED UNI-PAK 21 TAB) 10 MG (21) TBPK tablet Take as prescribed. 21 tablet Lendora Keys, [place] N, [place]      PDMP not reviewed this encounter.   Satya Bohall, [place] N, [place] 01/23/23 1940

## 2023-01-23 NOTE — ED Triage Notes (Signed)
Pt presents to uc with co of chest congestion, and head congestion for 4 days. Pt has been taking otc cold and flu meds.

## 2023-02-11 ENCOUNTER — Other Ambulatory Visit: Payer: Self-pay | Admitting: Family Medicine

## 2023-02-11 ENCOUNTER — Other Ambulatory Visit (HOSPITAL_COMMUNITY): Payer: Self-pay

## 2023-02-11 MED ORDER — TESTOSTERONE CYPIONATE 200 MG/ML IM SOLN
200.0000 mg | INTRAMUSCULAR | 0 refills | Status: DC
  Filled 2023-02-11: qty 6, 84d supply, fill #0
  Filled 2023-05-11: qty 4, 56d supply, fill #1

## 2023-02-11 NOTE — Telephone Encounter (Signed)
Prescription Request  02/11/2023  LOV: 09/29/2022  What is the name of the medication or equipment? testosterone cypionate testosterone cypionate (DEPOTESTOSTERONE CYPIONATE) 200 MG/ML injection  Pt states he only has one dosage left and will be out on Sunday.  Have you contacted your pharmacy to request a refill? No   Which pharmacy would you like this sent to?   [place] [facility] Phone: [phone]  Fax: [phone]       Patient notified that their request is being sent to the clinical staff for review and that they should receive a response within 2 business days.   Please advise at Mobile [phone] (mobile)

## 2023-02-13 ENCOUNTER — Other Ambulatory Visit (HOSPITAL_COMMUNITY): Payer: Self-pay

## 2023-02-25 DIAGNOSIS — D3132 Benign neoplasm of left choroid: Secondary | ICD-10-CM | POA: Diagnosis not present

## 2023-02-25 DIAGNOSIS — H43813 Vitreous degeneration, bilateral: Secondary | ICD-10-CM | POA: Diagnosis not present

## 2023-02-25 DIAGNOSIS — H524 Presbyopia: Secondary | ICD-10-CM | POA: Diagnosis not present

## 2023-02-25 DIAGNOSIS — H5203 Hypermetropia, bilateral: Secondary | ICD-10-CM | POA: Diagnosis not present

## 2023-02-25 DIAGNOSIS — H04123 Dry eye syndrome of bilateral lacrimal glands: Secondary | ICD-10-CM | POA: Diagnosis not present

## 2023-02-25 DIAGNOSIS — H52223 Regular astigmatism, bilateral: Secondary | ICD-10-CM | POA: Diagnosis not present

## 2023-03-04 ENCOUNTER — Encounter: Payer: Self-pay | Admitting: Family Medicine

## 2023-03-09 ENCOUNTER — Telehealth: Payer: 59 | Admitting: Family Medicine

## 2023-03-09 ENCOUNTER — Other Ambulatory Visit (HOSPITAL_COMMUNITY): Payer: Self-pay

## 2023-03-09 ENCOUNTER — Encounter: Payer: Self-pay | Admitting: Family Medicine

## 2023-03-09 VITALS — Ht 67.0 in | Wt 164.0 lb

## 2023-03-09 DIAGNOSIS — F5101 Primary insomnia: Secondary | ICD-10-CM | POA: Diagnosis not present

## 2023-03-09 DIAGNOSIS — R7989 Other specified abnormal findings of blood chemistry: Secondary | ICD-10-CM | POA: Diagnosis not present

## 2023-03-09 MED ORDER — ESZOPICLONE 2 MG PO TABS
2.0000 mg | ORAL_TABLET | Freq: Every evening | ORAL | 1 refills | Status: DC | PRN
  Filled 2023-03-09: qty 30, 30d supply, fill #0
  Filled 2023-05-11: qty 30, 30d supply, fill #1

## 2023-03-09 NOTE — Progress Notes (Signed)
Patient ID: Neil Cox, male   DOB: May 01, 1966, 57 y.o.   MRN: [id number]   Virtual Visit via Video Note  I connected with Neil Cox on 03/09/23 at  2:30 PM EDT by a video enabled telemedicine application and verified that I am speaking with the correct person using two identifiers.  Location patient: home Location provider:work or home office Persons participating in the virtual visit: patient, provider  I discussed the limitations of evaluation and management by telemedicine and the availability of in person appointments. The patient expressed understanding and agreed to proceed.   HPI: He called to discuss predominantly insomnia issues.  For quite some time he had difficulty with waking up 3 to 4 AM and unable to get back to sleep.  He usually gets about 4 and half hours sleep at night and feels like he needs considerably more.  He has tried melatonin 10 mg without success.  Benadryl left drowsiness and fatigue the next day.  He previously tried trazodone without any success.  No caffeine after about 9 AM.  No recent alcohol use.  Does have history of obstructive sleep apnea.  Using CPAP fairly regularly.  Still having considerable daytime drowsiness late in the day which she attributes to the poor sleep quality.  He has low testosterone.  Recent CBC unremarkable.  Recent testosterone level over 600.  He has questions regarding checking free and total testosterone with next labs   ROS: See pertinent positives and negatives per HPI.  Past Medical History:  Diagnosis Date   Allergy    Arthritis    Cancer (HCC) 2001   right thumb cancer/squamous cell   DDD (degenerative disc disease), lumbosacral    L3-L4   GERD (gastroesophageal reflux disease)    Hypertension    in past    Past Surgical History:  Procedure Laterality Date   EXCISION MORTON'S NEUROMA     30 years ago   thumb surg     had cancer right thumb   UPPER GASTROINTESTINAL ENDOSCOPY     occasional GERD/no  meds    Family History  Problem Relation Age of Onset   Arthritis Father    Diabetes Father    Osteoarthritis Mother    Arthritis Paternal Grandmother    Hypertension Paternal Grandmother    Diabetes Brother    Colon cancer Neg Hx    Stomach cancer Neg Hx    Rectal cancer Neg Hx     SOCIAL HX: Non-smoker   Current Outpatient Medications:    Ascorbic Acid (VITA-C PO), Take 500 mg by mouth daily., Disp: , Rfl:    cholecalciferol (VITAMIN D) 1000 units tablet, Take 5,000 Units by mouth daily., Disp: , Rfl:    eszopiclone (LUNESTA) 2 MG TABS tablet, Take 1 tablet (2 mg total) by mouth at bedtime as needed for sleep. Take immediately before bedtime, Disp: 30 tablet, Rfl: 1   Multiple Vitamin (ONE-A-DAY MENS PO), Take by mouth daily., Disp: , Rfl:    naproxen sodium (ALEVE) 220 MG tablet, Take 220 mg by mouth as needed., Disp: , Rfl:    NEEDLE, DISP, 22 G (BD DISP NEEDLES) 22G X 1-1/2" MISC, Use as directed, Disp: 10 each, Rfl: 0   Syringe, Disposable, (B-D SYRINGE LUER-LOK 3CC) 3 ML MISC, Use as directed, Disp: 10 each, Rfl: 0   testosterone cypionate (DEPOTESTOSTERONE CYPIONATE) 200 MG/ML injection, Inject 1 mL (200 mg total) into the muscle every 14 (fourteen) days., Disp: 10 mL, Rfl: 0  Current Facility-Administered  Medications:    0.9 %  sodium chloride infusion, 500 mL, Intravenous, Once, Pyrtle, Carie Caddy, MD   testosterone cypionate (DEPOTESTOSTERONE CYPIONATE) injection 200 mg, 200 mg, Intramuscular, Q14 Days, Caysen Whang, Elberta Fortis, MD, 200 mg at 07/21/22 0945  EXAM:  VITALS per patient if applicable:  GENERAL: alert, oriented, appears well and in no acute distress  HEENT: atraumatic, conjunttiva clear, no obvious abnormalities on inspection of external nose and ears  NECK: normal movements of the head and neck  LUNGS: on inspection no signs of respiratory distress, breathing rate appears normal, no obvious gross SOB, gasping or wheezing  CV: no obvious cyanosis  MS: moves  all visible extremities without noticeable abnormality  PSYCH/NEURO: pleasant and cooperative, no obvious depression or anxiety, speech and thought processing grossly intact  ASSESSMENT AND PLAN:  Discussed the following assessment and plan:  #1 transient insomnia.  Sleep hygiene discussed.  Continue to avoid late day use of caffeine and alcohol. -He has tried multiple things as above without success including melatonin, Benadryl, and trazodone.  We discussed short-term trial of Lunesta 2 mg nightly as needed and avoid regular use.  Dispense #30 with 1 refill  #2 low testosterone.  Patient on replacement.  Discussed future labs in about 1 month with CBC and free and total testosterone     I discussed the assessment and treatment plan with the patient. The patient was provided an opportunity to ask questions and all were answered. The patient agreed with the plan and demonstrated an understanding of the instructions.   The patient was advised to call back or seek an in-person evaluation if the symptoms worsen or if the condition fails to improve as anticipated.     Evelena Peat, MD

## 2023-05-12 ENCOUNTER — Other Ambulatory Visit: Payer: Self-pay

## 2023-05-13 ENCOUNTER — Other Ambulatory Visit: Payer: 59

## 2023-05-20 ENCOUNTER — Other Ambulatory Visit: Payer: 59

## 2023-05-20 DIAGNOSIS — R7989 Other specified abnormal findings of blood chemistry: Secondary | ICD-10-CM

## 2023-05-20 LAB — CBC WITH DIFFERENTIAL/PLATELET
Basophils Absolute: 0 10*3/uL (ref 0.0–0.1)
Basophils Relative: 0.4 % (ref 0.0–3.0)
Eosinophils Absolute: 0.1 10*3/uL (ref 0.0–0.7)
Eosinophils Relative: 1.1 % (ref 0.0–5.0)
HCT: 46.8 % (ref 39.0–52.0)
Hemoglobin: 15.4 g/dL (ref 13.0–17.0)
Lymphocytes Relative: 33.9 % (ref 12.0–46.0)
Lymphs Abs: 1.6 10*3/uL (ref 0.7–4.0)
MCHC: 32.8 g/dL (ref 30.0–36.0)
MCV: 92.4 fL (ref 78.0–100.0)
Monocytes Absolute: 0.4 10*3/uL (ref 0.1–1.0)
Monocytes Relative: 8.1 % (ref 3.0–12.0)
Neutro Abs: 2.7 10*3/uL (ref 1.4–7.7)
Neutrophils Relative %: 56.5 % (ref 43.0–77.0)
Platelets: 175 10*3/uL (ref 150.0–400.0)
RBC: 5.07 Mil/uL (ref 4.22–5.81)
RDW: 14.8 % (ref 11.5–15.5)
WBC: 4.7 10*3/uL (ref 4.0–10.5)

## 2023-05-20 LAB — TESTOSTERONE: Testosterone: 793.93 ng/dL (ref 300.00–890.00)

## 2023-05-21 LAB — TESTOSTERONE TOTAL,FREE,BIO, MALES
Albumin: 4.4 g/dL (ref 3.6–5.1)
Sex Hormone Binding: 32 nmol/L (ref 22–77)
Testosterone, Bioavailable: 382.2 ng/dL (ref 110.0–575.0)
Testosterone, Free: 189.9 pg/mL (ref 46.0–224.0)
Testosterone: 1115 ng/dL — ABNORMAL HIGH (ref 250–827)

## 2023-06-20 IMAGING — DX DG LUMBAR SPINE COMPLETE 4+V
5 series · 5 of 5 positions shown · non-contrast
Comparison: Lumbar spine MRI dated 05/12/2003.

CLINICAL DATA: Back pain.

EXAM:
LUMBAR SPINE - COMPLETE 4+ VIEW

[l-spine ap]
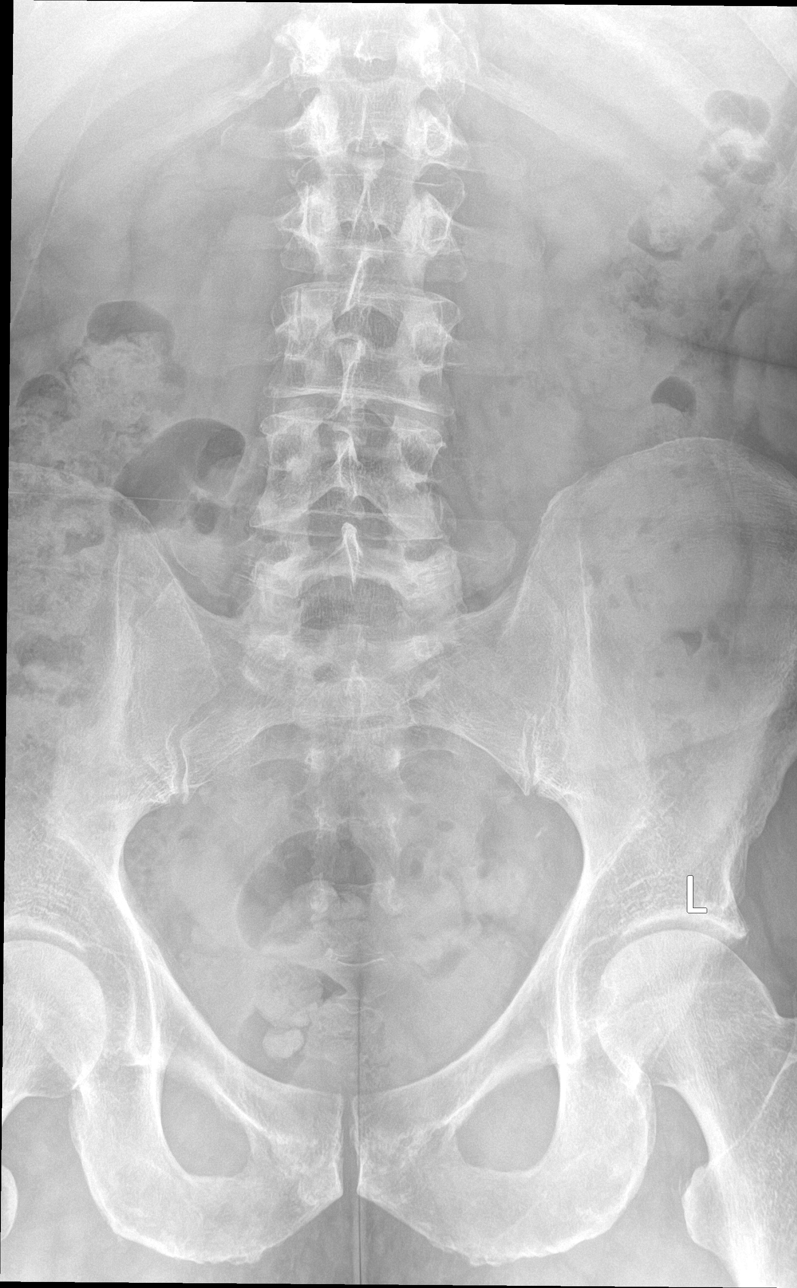

[l-spine obl (1 of 2)]
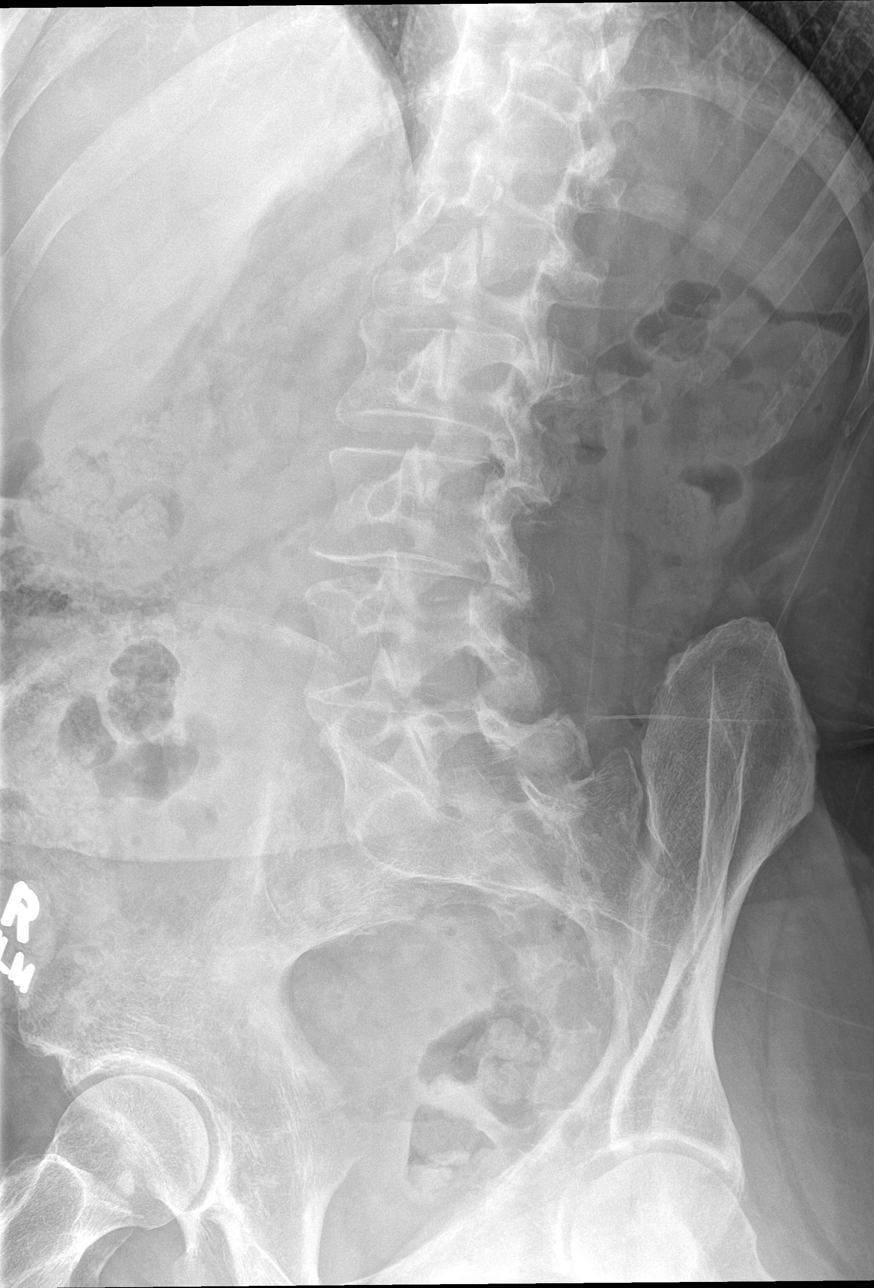

[l-spine obl (2 of 2)]
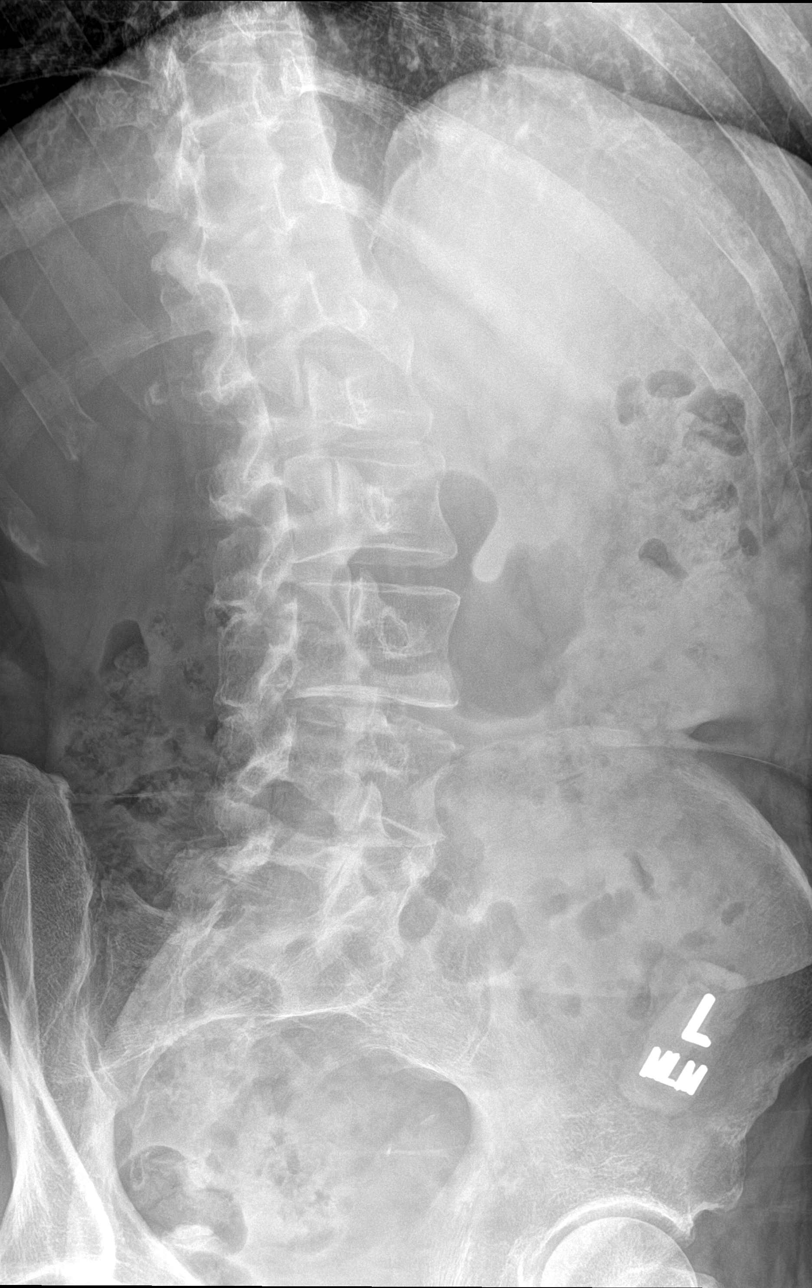

[l-spine lat]
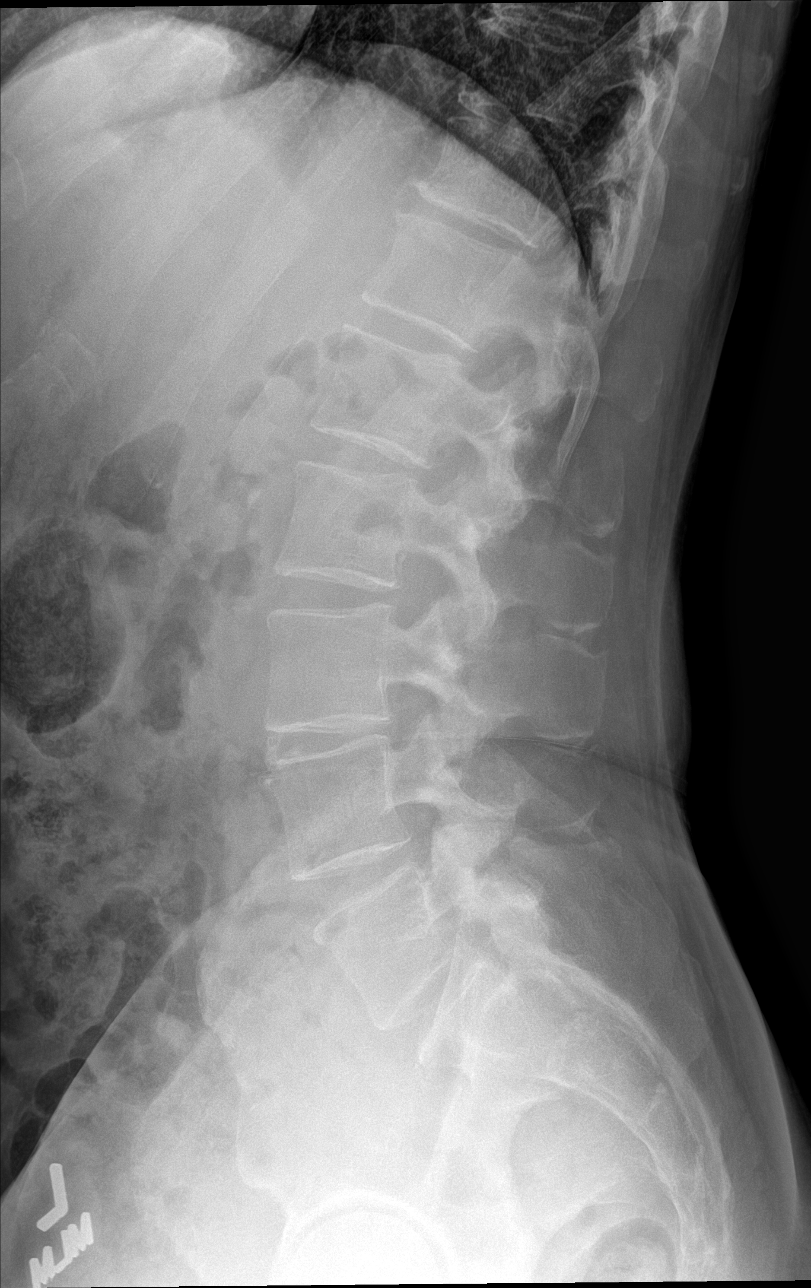

[l-spine spot]
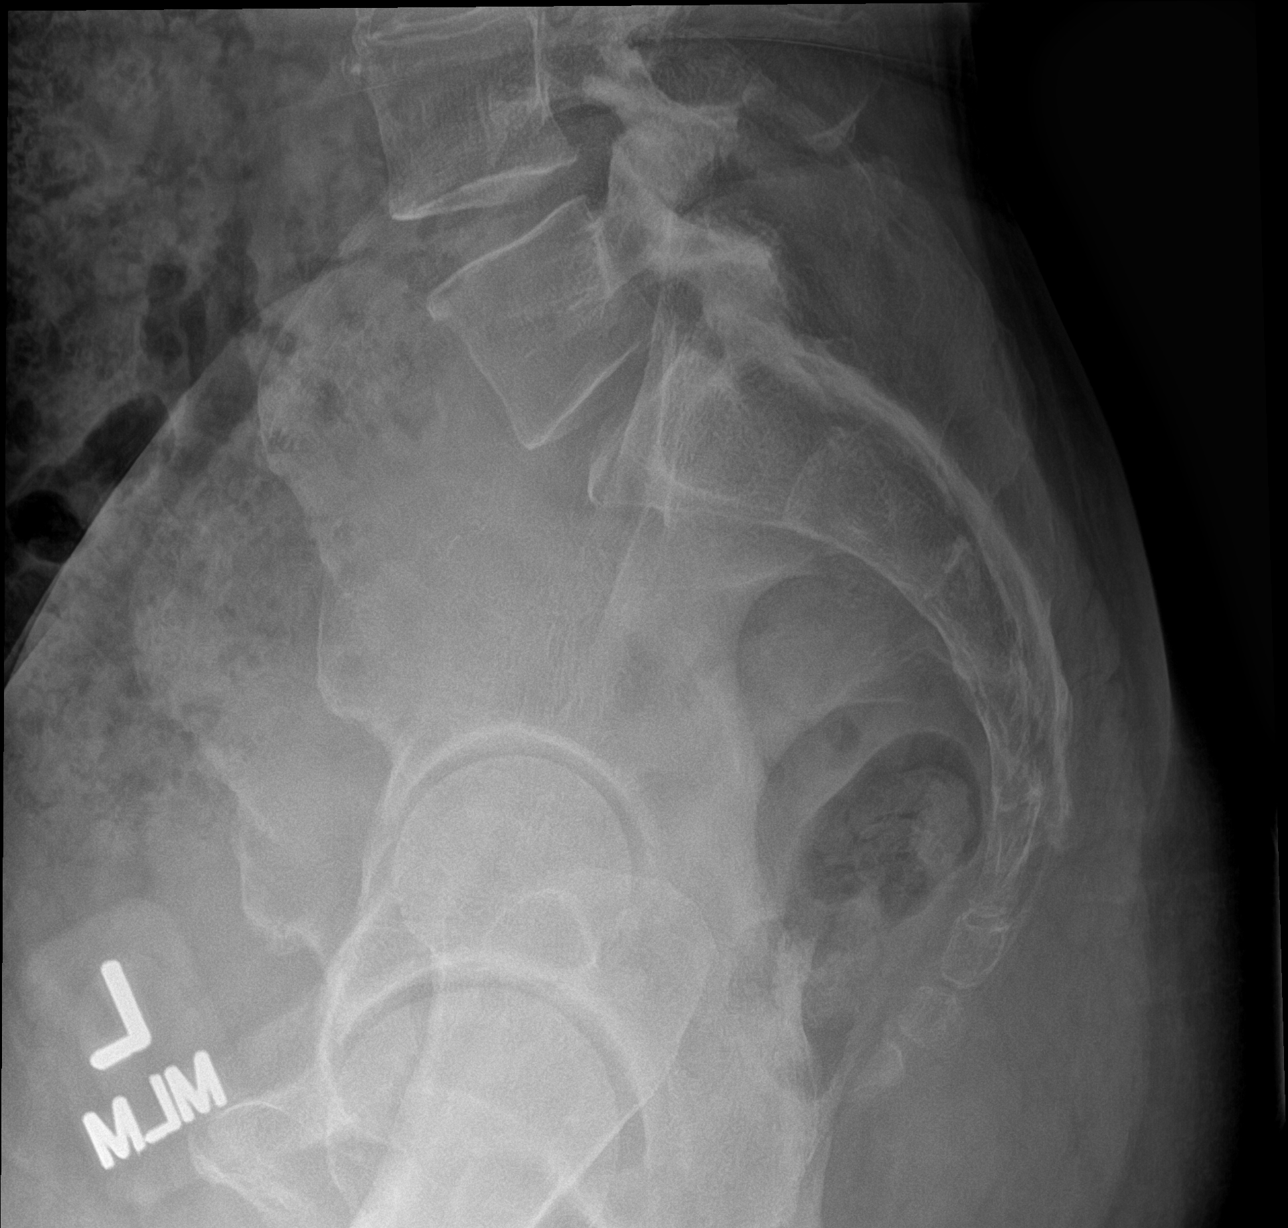

[5 of 5 positions shown; findings below may reference images not displayed]

FINDINGS: 5 lumbar type vertebra. There is no acute fracture or subluxation of
the lumbar spine. The vertebral body heights are maintained. Mild
degenerative changes and disc space narrowing. The visualized
posterior elements are intact. The soft tissues are unremarkable.
IMPRESSION: No acute/traumatic lumbar spine pathology.

## 2023-07-08 ENCOUNTER — Other Ambulatory Visit (HOSPITAL_COMMUNITY): Payer: Self-pay

## 2023-07-08 ENCOUNTER — Other Ambulatory Visit: Payer: Self-pay | Admitting: Family Medicine

## 2023-07-10 MED ORDER — TESTOSTERONE CYPIONATE 200 MG/ML IM SOLN
200.0000 mg | INTRAMUSCULAR | 0 refills | Status: DC
  Filled 2023-07-10: qty 6, 84d supply, fill #0
  Filled 2023-09-14: qty 4, 56d supply, fill #1
  Filled 2023-10-07: qty 4, 56d supply, fill #0

## 2023-07-11 ENCOUNTER — Other Ambulatory Visit: Payer: Self-pay

## 2023-07-12 ENCOUNTER — Other Ambulatory Visit (HOSPITAL_COMMUNITY): Payer: Self-pay

## 2023-07-13 ENCOUNTER — Other Ambulatory Visit (HOSPITAL_COMMUNITY): Payer: Self-pay

## 2023-07-15 ENCOUNTER — Other Ambulatory Visit (HOSPITAL_COMMUNITY): Payer: Self-pay

## 2023-09-13 ENCOUNTER — Other Ambulatory Visit: Payer: Self-pay | Admitting: Family Medicine

## 2023-09-14 ENCOUNTER — Encounter (HOSPITAL_COMMUNITY): Payer: Self-pay

## 2023-09-14 ENCOUNTER — Other Ambulatory Visit (HOSPITAL_COMMUNITY): Payer: Self-pay

## 2023-09-14 MED ORDER — "BD LUER-LOK SYRINGE 18G X 1-1/2"" 3 ML MISC"
0 refills | Status: DC
  Filled 2023-09-14: qty 10, 90d supply, fill #0
  Filled 2023-09-14: qty 10, 10d supply, fill #0
  Filled 2023-09-16: qty 10, 70d supply, fill #0

## 2023-09-14 MED ORDER — "NEEDLE (DISP) 23G X 1"" MISC"
0 refills | Status: DC
  Filled 2023-09-14: qty 10, 10d supply, fill #0
  Filled 2023-09-14: qty 10, 90d supply, fill #0
  Filled 2023-09-16: qty 10, 70d supply, fill #0

## 2023-09-16 ENCOUNTER — Other Ambulatory Visit (HOSPITAL_COMMUNITY): Payer: Self-pay

## 2023-09-23 ENCOUNTER — Other Ambulatory Visit (HOSPITAL_BASED_OUTPATIENT_CLINIC_OR_DEPARTMENT_OTHER): Payer: Self-pay

## 2023-09-23 ENCOUNTER — Telehealth: Payer: 59 | Admitting: Physician Assistant

## 2023-09-23 DIAGNOSIS — K649 Unspecified hemorrhoids: Secondary | ICD-10-CM

## 2023-09-23 MED ORDER — HYDROCORTISONE ACETATE 25 MG RE SUPP
25.0000 mg | Freq: Two times a day (BID) | RECTAL | 0 refills | Status: DC
Start: 2023-09-23 — End: 2024-04-06
  Filled 2023-09-23: qty 12, 6d supply, fill #0

## 2023-09-23 NOTE — Progress Notes (Signed)

## 2023-09-25 ENCOUNTER — Other Ambulatory Visit: Payer: Self-pay | Admitting: Family Medicine

## 2023-09-28 ENCOUNTER — Other Ambulatory Visit (HOSPITAL_COMMUNITY): Payer: Self-pay

## 2023-09-28 MED ORDER — ESZOPICLONE 2 MG PO TABS
2.0000 mg | ORAL_TABLET | Freq: Every evening | ORAL | 0 refills | Status: DC | PRN
  Filled 2023-09-28: qty 30, 30d supply, fill #0

## 2023-09-28 NOTE — Telephone Encounter (Signed)
Refilled Lunesta.   Try to avoid regular use  Kristian Covey MD [facility]

## 2023-10-05 ENCOUNTER — Other Ambulatory Visit: Payer: Self-pay | Admitting: Family Medicine

## 2023-10-05 NOTE — Telephone Encounter (Signed)
 Last Fill: 07/10/23  Last OV: 03/09/23 Next OV: None Scheduled  Routing to provider for review/authorization.

## 2023-10-05 NOTE — Telephone Encounter (Signed)
 Copied from CRM [id number]. Topic: Clinical - Medication Refill >> Oct 05, 2023  2:40 PM Isabell A wrote: Most Recent Primary Care Visit:  Provider: [facility]  Department: [facility]  Visit Type: LAB  Date: 05/20/2023  Medication: testosterone cypionate (DEPOTESTOSTERONE CYPIONATE) 200 MG/ML injection  Has the patient contacted their pharmacy? No (Agent: If no, request that the patient contact the pharmacy for the refill. If patient does not wish to contact the pharmacy document the reason why and proceed with request.) (Agent: If yes, when and what did the pharmacy advise?)  Is this the correct pharmacy for this prescription? Yes If no, delete pharmacy and type the correct one.  This is the patient's preferred pharmacy:  [facility] [address] [place] [address] Phone: [phone] Fax: [phone]   Has the prescription been filled recently? Yes  Is the patient out of the medication? Yes  Has the patient been seen for an appointment in the last year OR does the patient have an upcoming appointment? Yes  Can we respond through MyChart? Yes  Agent: Please be advised that Rx refills may take up to 3 business days. We ask that you follow-up with your pharmacy.

## 2023-10-07 ENCOUNTER — Other Ambulatory Visit: Payer: Self-pay | Admitting: Family Medicine

## 2023-10-07 ENCOUNTER — Other Ambulatory Visit (HOSPITAL_BASED_OUTPATIENT_CLINIC_OR_DEPARTMENT_OTHER): Payer: Self-pay

## 2023-10-07 MED ORDER — "BD LUER-LOK SYRINGE 18G X 1-1/2"" 3 ML MISC"
0 refills | Status: DC
  Filled 2023-10-07: qty 10, fill #0
  Filled 2023-11-03: qty 10, 30d supply, fill #0

## 2023-10-07 MED ORDER — TESTOSTERONE CYPIONATE 200 MG/ML IM SOLN
200.0000 mg | INTRAMUSCULAR | 0 refills | Status: DC
  Filled 2023-10-07 – 2023-10-09 (×2): qty 2, 28d supply, fill #0
  Filled 2023-11-03: qty 2, 28d supply, fill #1
  Filled 2023-12-02: qty 2, 28d supply, fill #2
  Filled 2023-12-30: qty 2, 28d supply, fill #3
  Filled 2024-01-01: qty 4, 56d supply, fill #3

## 2023-10-07 NOTE — Telephone Encounter (Signed)
 Refill sent.  Does need follow up PSA at some point this year  Neil Covey MD [facility]

## 2023-10-08 ENCOUNTER — Other Ambulatory Visit (HOSPITAL_BASED_OUTPATIENT_CLINIC_OR_DEPARTMENT_OTHER): Payer: Self-pay

## 2023-10-09 ENCOUNTER — Other Ambulatory Visit: Payer: Self-pay

## 2023-10-09 ENCOUNTER — Other Ambulatory Visit (HOSPITAL_BASED_OUTPATIENT_CLINIC_OR_DEPARTMENT_OTHER): Payer: Self-pay

## 2023-10-09 ENCOUNTER — Telehealth: Payer: Self-pay

## 2023-10-09 NOTE — Telephone Encounter (Signed)
 Clinical questions have been answered and PA submitted. PA currently Pending.

## 2023-10-09 NOTE — Telephone Encounter (Signed)
 PA request has been Started. New Encounter created for follow up. For additional info see Pharmacy Prior Auth telephone encounter from 10/09/23.

## 2023-10-09 NOTE — Telephone Encounter (Signed)
 Patient is needing PA on Testosterone

## 2023-10-09 NOTE — Telephone Encounter (Signed)
 Pharmacy Patient Advocate Encounter   Received notification from Pt Calls Messages that prior authorization for Testosterone Cypionate 200MG /ML intramuscular solution is required/requested.   Insurance verification completed.   The patient is insured through [facility] .   Per test claim: PA required; PA started via CoverMyMeds. KEY [id number] . Waiting for clinical questions to populate.

## 2023-10-09 NOTE — Telephone Encounter (Signed)
 Copied from CRM [id number]. Topic: Clinical - Prescription Issue >> Oct 09, 2023  1:38 PM Kathryne Eriksson wrote: Reason for CRM: testosterone cypionate (DEPOTESTOSTERONE CYPIONATE) 200 MG/ML injection >> Oct 09, 2023  1:41 PM Kathryne Eriksson wrote: Patient states the pharmacy sent over paperwork on behalf of testosterone cypionate (DEPOTESTOSTERONE CYPIONATE) 200 MG/ML injection needing providers approval and signature. Patient states pharmacy has NOT received those forms back, requesting a call back with update at [phone]

## 2023-10-12 ENCOUNTER — Other Ambulatory Visit (HOSPITAL_COMMUNITY): Payer: Self-pay

## 2023-10-12 ENCOUNTER — Other Ambulatory Visit (HOSPITAL_BASED_OUTPATIENT_CLINIC_OR_DEPARTMENT_OTHER): Payer: Self-pay

## 2023-10-12 NOTE — Telephone Encounter (Signed)
 Pharmacy has been notified.

## 2023-10-12 NOTE — Telephone Encounter (Signed)
 Pharmacy Patient Advocate Encounter  Received notification from [facility] that Prior Authorization for Testosterone Cypionate 200MG /ML intramuscular solution has been APPROVED from 10/09/23 to 10/08/24. Ran test claim, Copay is $23.78. This test claim was processed through [facility]- copay amounts may vary at other pharmacies due to pharmacy/plan contracts, or as the patient moves through the different stages of their insurance plan.   PA #/Case ID/Reference #: [id number]

## 2023-10-13 ENCOUNTER — Other Ambulatory Visit (HOSPITAL_COMMUNITY): Payer: Self-pay

## 2023-11-03 ENCOUNTER — Other Ambulatory Visit: Payer: Self-pay

## 2023-11-03 ENCOUNTER — Other Ambulatory Visit (HOSPITAL_BASED_OUTPATIENT_CLINIC_OR_DEPARTMENT_OTHER): Payer: Self-pay

## 2023-11-03 ENCOUNTER — Other Ambulatory Visit: Payer: Self-pay | Admitting: Family Medicine

## 2023-11-04 ENCOUNTER — Other Ambulatory Visit (HOSPITAL_BASED_OUTPATIENT_CLINIC_OR_DEPARTMENT_OTHER): Payer: Self-pay

## 2023-11-04 MED ORDER — "BD HYPODERMIC NEEDLE 23G X 1"" MISC"
0 refills | Status: DC
  Filled 2023-11-04: qty 10, 28d supply, fill #0

## 2023-11-11 NOTE — Progress Notes (Deleted)
 DME note

## 2023-12-02 ENCOUNTER — Other Ambulatory Visit: Payer: Self-pay | Admitting: Family Medicine

## 2023-12-02 ENCOUNTER — Other Ambulatory Visit (HOSPITAL_BASED_OUTPATIENT_CLINIC_OR_DEPARTMENT_OTHER): Payer: Self-pay

## 2023-12-02 ENCOUNTER — Other Ambulatory Visit: Payer: Self-pay

## 2023-12-02 MED ORDER — "BD DISP NEEDLE 23G X 1"" MISC"
0 refills | Status: DC
  Filled 2023-12-02: qty 10, 10d supply, fill #0

## 2023-12-02 MED ORDER — "BD LUER-LOK SYRINGE 18G X 1-1/2"" 3 ML MISC"
0 refills | Status: DC
  Filled 2023-12-02: qty 10, 10d supply, fill #0

## 2023-12-04 ENCOUNTER — Other Ambulatory Visit (HOSPITAL_BASED_OUTPATIENT_CLINIC_OR_DEPARTMENT_OTHER): Payer: Self-pay

## 2023-12-30 ENCOUNTER — Other Ambulatory Visit (HOSPITAL_BASED_OUTPATIENT_CLINIC_OR_DEPARTMENT_OTHER): Payer: Self-pay

## 2024-01-01 ENCOUNTER — Other Ambulatory Visit: Payer: Self-pay

## 2024-02-10 ENCOUNTER — Telehealth: Payer: Self-pay

## 2024-02-10 NOTE — Telephone Encounter (Signed)
 Airview download from 01/11/24-02/09/24 showed apneas were well controlled on pressure settings 7cm. His AHI score 0.4 (<1 ever per hour). Is he waking up because of snoring or gasping/choking? Has he tried any OTC or prescription sleep aids

## 2024-02-10 NOTE — Telephone Encounter (Signed)
 Copied from CRM [id number]. Topic: Clinical - Medical Advice >> Feb 10, 2024 11:54 AM Leila BROCKS wrote: Reason for CRM: Patient [phone] wants to see if the office can bump the pressure on cpap machine, the setting is at 7 now. Patient is not sleeping well, about 4-5 hours of sleeping, once awake cannot fall back asleep. Patient last saw NP,  Hope 02/19/22. Please advise and call back.  Please advise [employer]

## 2024-02-11 NOTE — Telephone Encounter (Signed)
 Called the pt and there was no answer- LMTCB

## 2024-02-17 ENCOUNTER — Ambulatory Visit: Payer: Self-pay | Admitting: Family Medicine

## 2024-02-17 DIAGNOSIS — G4733 Obstructive sleep apnea (adult) (pediatric): Secondary | ICD-10-CM

## 2024-02-17 NOTE — Telephone Encounter (Signed)
 FYI Only or Action Required?: Action required by provider: clinical question for provider.  Patient is followed in Pulmonology for obstructive sleep apnea, last seen on 02/19/2022 by Hope Almarie ORN, NP.  Called Nurse Triage reporting Advice Only.  Triage Disposition: Call PCP When Office is Open  Patient/caregiver understands and will follow disposition?: Yes       Wants to increase pressure on CPAP to see if it helps with his sleep.  Pt states he sleeps 4.5-5 hours each night. Call back number is [phone]               Copied from CRM [id number]. Topic: Clinical - Request for Lab/Test Order >> Feb 17, 2024  3:14 PM Neil Cox wrote: Reason for CRM: pt wanting to increase CPAP settings, phone note in chart Reason for Disposition  [1] Caller requesting NON-URGENT health information AND [2] PCP's office is the best resource  Protocols used: Information Only Call - No Triage-A-AH

## 2024-02-18 NOTE — Telephone Encounter (Signed)
 I tried calling the patient x1. Voicemail not set up. Pressure setting has been changed to 9cm h20 per [place]m.  Sending MyChart message.

## 2024-02-18 NOTE — Telephone Encounter (Signed)
 Please change CPAP pressure 9cm h20

## 2024-02-21 ENCOUNTER — Other Ambulatory Visit: Payer: Self-pay | Admitting: Family Medicine

## 2024-02-22 ENCOUNTER — Other Ambulatory Visit: Payer: Self-pay

## 2024-02-22 DIAGNOSIS — G4733 Obstructive sleep apnea (adult) (pediatric): Secondary | ICD-10-CM

## 2024-02-22 MED ORDER — "BD LUER-LOK SYRINGE 18G X 1-1/2"" 3 ML MISC"
0 refills | Status: DC
  Filled 2024-02-22: qty 10, 140d supply, fill #0

## 2024-02-22 MED ORDER — TESTOSTERONE CYPIONATE 200 MG/ML IM SOLN
200.0000 mg | INTRAMUSCULAR | 0 refills | Status: DC
  Filled 2024-02-22 – 2024-02-24 (×2): qty 4, 56d supply, fill #0
  Filled 2024-04-18: qty 4, 56d supply, fill #1
  Filled 2024-06-14: qty 2, 28d supply, fill #2

## 2024-02-22 MED ORDER — "BD DISP NEEDLE 23G X 1"" MISC"
0 refills | Status: DC
  Filled 2024-02-22: qty 10, 140d supply, fill #0

## 2024-02-22 NOTE — Telephone Encounter (Signed)
 Recommend trying CPAP pressure auto settings 5-10cm h20. Please place order

## 2024-02-22 NOTE — Telephone Encounter (Addendum)
 Pressure settings have been changed. Order placed to [facility] as well

## 2024-02-23 ENCOUNTER — Other Ambulatory Visit: Payer: Self-pay

## 2024-02-23 ENCOUNTER — Other Ambulatory Visit (HOSPITAL_BASED_OUTPATIENT_CLINIC_OR_DEPARTMENT_OTHER): Payer: Self-pay

## 2024-02-23 NOTE — Telephone Encounter (Signed)
 Pressure has been changed to 5cm H20

## 2024-02-23 NOTE — Telephone Encounter (Signed)
 We can change to pressure 5cm h20 and let us  know how that works

## 2024-02-24 ENCOUNTER — Other Ambulatory Visit (HOSPITAL_BASED_OUTPATIENT_CLINIC_OR_DEPARTMENT_OTHER): Payer: Self-pay

## 2024-03-03 NOTE — Addendum Note (Signed)
 Addended byBETHA FRIES, Nel Stoneking A on: 03/03/2024 11:40 AM   Modules accepted: Orders

## 2024-03-04 ENCOUNTER — Ambulatory Visit: Payer: Self-pay

## 2024-03-04 NOTE — Telephone Encounter (Signed)
 FYI Only or Action Required?: Action required by provider: DME CPAP Setting Order.  Patient is followed in Pulmonology for OSA, last seen on 02/19/2022 by Hope Almarie ORN, NP.  Called Nurse Triage reporting Medical Management of Chronic Issues.   Triage Disposition: Call PCP When Office is Open  Patient/caregiver understands and will follow disposition?: Yes     Copied from CRM #[id number]. Topic: Clinical - Red Word Triage >> Mar 04, 2024  5:09 PM Celestine FALCON wrote: Red Word that prompted transfer to Nurse Triage: As a heads up I don't believe this is red flag, as it is borderline with CPAP machine malfunction red flag word, but I didn't know how else to assist the pt.   Pt is calling regarding the order placed to [facility] for his CPAP machine settings being changed from 5cm - 10cm to just 5cm. Pt stated he got called by [facility] And they changed it back to 5cm - 10cm instead of leaving it at the correct amount 5cm. Pt is upset and wanted to speak to someone about assisting with this order,  possibly refaxing it etc. I did let the pt know that the clinic did close today at 2:30pm so I wouldn't have a direct line to the clinic. He asked if there was any other options So I mentioned I could reach out to a nurse for advice on this matter.   Pt is a patient of NP Almarie Hope and has been speaking to [facility] regarding this matter. Pt's phone number is [phone]. Reason for Disposition  [1] Caller requesting NON-URGENT health information AND [2] PCP's office is the best resource  Answer Assessment - Initial Assessment Questions 1. REASON FOR CALL: What is the main reason for your call? or How can I best help you?     Pt calling to get CPAP machine pressure changed back. Pt reports pressure is supposed to be at 5 cm. Previous was on 5-10 cm to trial a few weeks ago, but pt reports 5cm was working. [facility] did note new order from 03/03/24 -- pt reports DME has not received 2.  SYMPTOMS : Do you have any symptoms?      denies 3. OTHER QUESTIONS: Do you have any other questions?     N/a Of note, pt will attempted to get alternate fax number from [facility]  Protocols used: Information Only Call - No Triage-A-AH

## 2024-03-07 NOTE — Telephone Encounter (Signed)
 Spoke with patient regarding prior message. Patient stated [facility] never received the order for the change of the CPAP. Advised patient order has been sent to [facility] on 03/03/2024 and I will send this message to our PCC's to look into this for us . Went on Airview and also changed the order for patient on that site .  PCC's can you please look into this .  Thank you

## 2024-03-08 NOTE — Telephone Encounter (Signed)
 Per Arvella at [facility] The updated order was sent back in again STAT for them to review. We have the order for 5, but I think they are looking at both orders. its been sent in again for review.

## 2024-03-09 ENCOUNTER — Telehealth: Payer: Self-pay

## 2024-03-09 DIAGNOSIS — R7989 Other specified abnormal findings of blood chemistry: Secondary | ICD-10-CM

## 2024-03-09 DIAGNOSIS — Z0000 Encounter for general adult medical examination without abnormal findings: Secondary | ICD-10-CM

## 2024-03-09 NOTE — Telephone Encounter (Signed)
 Copied from CRM [id number]. Topic: Clinical - Request for Lab/Test Order >> Mar 09, 2024  8:53 AM Neil Cox wrote: Reason for CRM: Pt would like orders for labwork to be completed a week before his appt for his physical, please.

## 2024-03-09 NOTE — Telephone Encounter (Signed)
 Order has been placed to change pressure.Nothing else further needed

## 2024-03-10 NOTE — Telephone Encounter (Signed)
 Spoke with the patient and informed him PCP approved orders as below.  Lab appt was scheduled on 8/21.

## 2024-03-10 NOTE — Addendum Note (Signed)
 Addended by: CHRISTYNE IDELL LABOR on: 03/10/2024 08:57 AM   Modules accepted: Orders

## 2024-03-16 NOTE — Procedures (Signed)
Mask fit

## 2024-03-31 ENCOUNTER — Ambulatory Visit

## 2024-03-31 ENCOUNTER — Other Ambulatory Visit (INDEPENDENT_AMBULATORY_CARE_PROVIDER_SITE_OTHER)

## 2024-03-31 DIAGNOSIS — Z0000 Encounter for general adult medical examination without abnormal findings: Secondary | ICD-10-CM

## 2024-03-31 DIAGNOSIS — R7989 Other specified abnormal findings of blood chemistry: Secondary | ICD-10-CM

## 2024-03-31 LAB — CBC WITH DIFFERENTIAL/PLATELET
Basophils Absolute: 0 K/uL (ref 0.0–0.1)
Basophils Relative: 0.4 % (ref 0.0–3.0)
Eosinophils Absolute: 0 K/uL (ref 0.0–0.7)
Eosinophils Relative: 0.7 % (ref 0.0–5.0)
HCT: 47.4 % (ref 39.0–52.0)
Hemoglobin: 15.9 g/dL (ref 13.0–17.0)
Lymphocytes Relative: 34.1 % (ref 12.0–46.0)
Lymphs Abs: 1.6 K/uL (ref 0.7–4.0)
MCHC: 33.5 g/dL (ref 30.0–36.0)
MCV: 91 fl (ref 78.0–100.0)
Monocytes Absolute: 0.3 K/uL (ref 0.1–1.0)
Monocytes Relative: 6.2 % (ref 3.0–12.0)
Neutro Abs: 2.8 K/uL (ref 1.4–7.7)
Neutrophils Relative %: 58.6 % (ref 43.0–77.0)
Platelets: 190 K/uL (ref 150.0–400.0)
RBC: 5.21 Mil/uL (ref 4.22–5.81)
RDW: 13.6 % (ref 11.5–15.5)
WBC: 4.8 K/uL (ref 4.0–10.5)

## 2024-03-31 LAB — COMPREHENSIVE METABOLIC PANEL WITH GFR
ALT: 19 U/L (ref 0–53)
AST: 19 U/L (ref 0–37)
Albumin: 4.8 g/dL (ref 3.5–5.2)
Alkaline Phosphatase: 36 U/L — ABNORMAL LOW (ref 39–117)
BUN: 19 mg/dL (ref 6–23)
CO2: 30 meq/L (ref 19–32)
Calcium: 9.3 mg/dL (ref 8.4–10.5)
Chloride: 100 meq/L (ref 96–112)
Creatinine, Ser: 0.99 mg/dL (ref 0.40–1.50)
GFR: 84.74 mL/min (ref >60.00)
Glucose, Bld: 109 mg/dL — ABNORMAL HIGH (ref 70–99)
Potassium: 4.2 meq/L (ref 3.5–5.1)
Sodium: 139 meq/L (ref 135–145)
Total Bilirubin: 1.1 mg/dL (ref 0.2–1.2)
Total Protein: 7.1 g/dL (ref 6.0–8.3)

## 2024-03-31 LAB — LIPID PANEL
Cholesterol: 176 mg/dL (ref 0–200)
HDL: 57.6 mg/dL (ref >39.00)
LDL Cholesterol: 108 mg/dL — ABNORMAL HIGH (ref 0–99)
NonHDL: 118.09
Total CHOL/HDL Ratio: 3
Triglycerides: 49 mg/dL (ref 0.0–149.0)
VLDL: 9.8 mg/dL (ref 0.0–40.0)

## 2024-03-31 LAB — TSH: TSH: 0.73 u[IU]/mL (ref 0.35–5.50)

## 2024-03-31 LAB — TESTOSTERONE: Testosterone: 1020 ng/dL — ABNORMAL HIGH (ref 300.00–890.00)

## 2024-03-31 LAB — PSA: PSA: 0.48 ng/mL (ref 0.10–4.00)

## 2024-04-03 ENCOUNTER — Ambulatory Visit: Payer: Self-pay | Admitting: Family Medicine

## 2024-04-05 ENCOUNTER — Other Ambulatory Visit (HOSPITAL_BASED_OUTPATIENT_CLINIC_OR_DEPARTMENT_OTHER): Payer: Self-pay

## 2024-04-05 ENCOUNTER — Encounter (HOSPITAL_BASED_OUTPATIENT_CLINIC_OR_DEPARTMENT_OTHER): Payer: Self-pay | Admitting: Primary Care

## 2024-04-05 ENCOUNTER — Ambulatory Visit (HOSPITAL_BASED_OUTPATIENT_CLINIC_OR_DEPARTMENT_OTHER): Admitting: Primary Care

## 2024-04-05 VITALS — BP 115/68 | HR 80 | Ht 67.0 in | Wt 168.0 lb

## 2024-04-05 DIAGNOSIS — F5104 Psychophysiologic insomnia: Secondary | ICD-10-CM

## 2024-04-05 DIAGNOSIS — G4733 Obstructive sleep apnea (adult) (pediatric): Secondary | ICD-10-CM | POA: Diagnosis not present

## 2024-04-05 MED ORDER — DOXEPIN HCL 6 MG PO TABS
1.0000 | ORAL_TABLET | Freq: Every day | ORAL | 5 refills | Status: AC
  Filled 2024-04-05: qty 30, 30d supply, fill #0
  Filled 2024-06-14: qty 30, 30d supply, fill #1

## 2024-04-05 NOTE — Progress Notes (Signed)
 @Patient  ID: Neil Cox, male    DOB: 26-Sep-1965, 58 y.o.   MRN: [id number]  Chief Complaint  Patient presents with   Follow-up    OSA     Referring provider: Micheal Wolm ORN, MD  HPI: 58 year old male, never smoked.  Past medical history significant for moderate obstructive sleep apnea.  Patient of Dr. Neda, seen for initial consult on 12/19/2020.  04/05/2024- Interim hx Discussed the use of AI scribe software for clinical note transcription with the patient, who gave verbal consent to proceed.  History of Present Illness Neil Cox is a 57 year old male with sleep apnea and insomnia who presents with difficulty maintaining sleep.  He experiences ongoing issues with sleep maintenance despite consistent use of his CPAP machine. He typically gets five to five and a half hours of sleep per night, which he feels is insufficient as he remains drowsy in the evenings. He has tried various strategies to improve his sleep, such as maintaining a regular bedtime between 10 to 11 PM, but often wakes up around 3:30 AM.  He has previously used Lunesta  but discontinued it as it did not extend his sleep duration. He has not taken any sleep medication for over a month. Recently, he took Benadryl on a Friday and Saturday night, which extended his sleep to six to six and a half hours, but he is concerned about its long-term effects and potential associations with dementia.  His CPAP machine is set at a pressure of five, which he finds effective. He has experimented with higher pressures, such as ten, but found them uncomfortable, causing dry mouth and waking him up. He is satisfied with the current setting as it does not disturb his sleep.  Airview download 03/02/24- 04/03/24  Days used 30/30 days Average usage 5 hours 41 mins Pressure 5cm h20 Airleaks 12.7L/min AHI 0.4   Not on File  Immunization History  Administered Date(s) Administered   Influenza Inj Mdck Quad Pf 05/11/2021    Influenza Split 05/23/2013   Influenza,inj,Quad PF,6+ Mos 05/18/2014, 05/22/2015, 05/09/2016, 05/20/2018, 05/16/2022   PFIZER(Purple Top)SARS-COV-2 Vaccination 08/29/2019, 09/17/2019   Rabies, IM 03/06/2015, 03/09/2015, 03/13/2015, 03/24/2015   Td 08/12/2003   Tdap 05/08/2014, 04/06/2024    Past Medical History:  Diagnosis Date   Allergy    Arthritis    Cancer (HCC) 2001   right thumb cancer/squamous cell   DDD (degenerative disc disease), lumbosacral    L3-L4   GERD (gastroesophageal reflux disease)    Hypertension    in past    Tobacco History: Social History   Tobacco Use  Smoking Status Never  Smokeless Tobacco Former   Types: Chew   Quit date: 09/03/1999   Counseling given: Not Answered   Outpatient Medications Prior to Visit  Medication Sig Dispense Refill   Ascorbic Acid (VITA-C PO) Take 500 mg by mouth daily.     cholecalciferol (VITAMIN D ) 1000 units tablet Take 5,000 Units by mouth daily.     Multiple Vitamin (ONE-A-DAY MENS PO) Take by mouth daily.     naproxen  sodium (ALEVE ) 220 MG tablet Take 220 mg by mouth as needed.     NEEDLE, DISP, 23 G (BD DISP NEEDLE) 23G X 1 MISC Use as directed with testosterone . 10 each 0   SYRINGE-NEEDLE, DISP, 3 ML (B-D 3CC LUER-LOK SYR 18GX1-1/2) 18G X 1-1/2 3 ML MISC Use as directed with testosterone . 10 each 0   testosterone  cypionate (DEPOTESTOSTERONE CYPIONATE) 200 MG/ML injection Inject 1 mL (200  mg total) into the muscle every 14 (fourteen) days. 10 mL 0   hydrocortisone  (ANUSOL -HC) 25 MG suppository Place 1 suppository (25 mg total) rectally 2 (two) times daily. 12 suppository 0   eszopiclone  (LUNESTA ) 2 MG TABS tablet Take 1 tablet (2 mg total) by mouth at bedtime as needed for sleep. Take immediately before bedtime (Patient not taking: Reported on 04/05/2024) 30 tablet 0   Facility-Administered Medications Prior to Visit  Medication Dose Route Frequency Provider Last Rate Last Admin   testosterone  cypionate  (DEPOTESTOSTERONE CYPIONATE) injection 200 mg  200 mg Intramuscular Q14 Days Micheal Wolm ORN, MD   200 mg at 07/21/22 0945      Review of Systems  Review of Systems  Constitutional: Negative.   Respiratory: Negative.    Psychiatric/Behavioral:  Positive for sleep disturbance.      Physical Exam  BP 115/68 (BP Location: Left Arm, Patient Position: Sitting)   Pulse 80   Ht 5' 7 (1.702 m)   Wt 168 lb (76.2 kg)   SpO2 98%   BMI 26.31 kg/m  Physical Exam Constitutional:      Appearance: Normal appearance. He is well-developed.  HENT:     Head: Normocephalic and atraumatic.     Mouth/Throat:     Mouth: Mucous membranes are moist.     Pharynx: Oropharynx is clear.  Cardiovascular:     Rate and Rhythm: Normal rate and regular rhythm.     Heart sounds: Normal heart sounds.  Pulmonary:     Effort: Pulmonary effort is normal. No respiratory distress.     Breath sounds: Normal breath sounds. No wheezing or rhonchi.  Musculoskeletal:        General: Normal range of motion.     Cervical back: Normal range of motion and neck supple.  Skin:    General: Skin is warm and dry.     Findings: No erythema or rash.  Neurological:     General: No focal deficit present.     Mental Status: He is alert and oriented to person, place, and time. Mental status is at baseline.  Psychiatric:        Mood and Affect: Mood normal.        Behavior: Behavior normal.        Thought Content: Thought content normal.        Judgment: Judgment normal.     Lab Results:  CBC    Component Value Date/Time   WBC 4.8 03/31/2024 0925   RBC 5.21 03/31/2024 0925   HGB 15.9 03/31/2024 0925   HCT 47.4 03/31/2024 0925   PLT 190.0 03/31/2024 0925   MCV 91.0 03/31/2024 0925   MCHC 33.5 03/31/2024 0925   RDW 13.6 03/31/2024 0925   LYMPHSABS 1.6 03/31/2024 0925   MONOABS 0.3 03/31/2024 0925   EOSABS 0.0 03/31/2024 0925   BASOSABS 0.0 03/31/2024 0925    BMET    Component Value Date/Time   NA 139  03/31/2024 0925   K 4.2 03/31/2024 0925   CL 100 03/31/2024 0925   CO2 30 03/31/2024 0925   GLUCOSE 109 (H) 03/31/2024 0925   BUN 19 03/31/2024 0925   CREATININE 0.99 03/31/2024 0925   CALCIUM 9.3 03/31/2024 0925   GFRNONAA 77.89 05/21/2009 0758    BNP No results found for: BNP  ProBNP No results found for: PROBNP  Imaging: No results found.   Assessment & Plan:   1. OSA (obstructive sleep apnea) (Primary)   Assessment and Plan Assessment & Plan  Obstructive sleep apnea Obstructive sleep apnea is well-managed with CPAP therapy at a pressure setting of 5cm H2O, effectively preventing apneic events without causing arousal. - Continue CPAP therapy at current pressure setting of 5 cm H2O  Chronic insomnia Chronic insomnia characterized by difficulty maintaining sleep, resulting in 5 to 5.5 hours of sleep per night. Lunesta  was discontinued due to inefficacy. Concerns about Benadryl's potential association with dementia. Doxepin  was chosen for its safety profile and potential to aid in sleep maintenance, with a preference for as-needed use to avoid daytime drowsiness. Typical doses for insomnia range from 3 to 6 mg. - Discontinue Lunesta  - Initiate doxepin  for insomnia, to be taken as needed - Monitor response to doxepin  and consider adding Atarax if doxepin  is not effective   Almarie LELON Ferrari, NP 04/18/2024

## 2024-04-05 NOTE — Patient Instructions (Signed)
  VISIT SUMMARY: Today, we discussed your ongoing issues with maintaining sleep despite using your CPAP machine consistently. You typically get around five to five and a half hours of sleep per night, which you feel is insufficient. We reviewed your previous use of Lunesta  and recent use of Benadryl, and we talked about your concerns regarding long-term effects. We also discussed your CPAP settings and your satisfaction with the current pressure.  YOUR PLAN: -OBSTRUCTIVE SLEEP APNEA: Obstructive sleep apnea is a condition where your airway becomes blocked during sleep, causing breathing pauses. Your CPAP machine is effectively managing this condition at a pressure setting of 5 cm H2O, preventing these pauses without waking you up. Continue using your CPAP machine at this setting.  -CHRONIC INSOMNIA: Chronic insomnia is a condition where you have trouble staying asleep. You are currently getting 5 to 5.5 hours of sleep per night. We will discontinue Lunesta  as it was not effective for you. Instead, you will start taking doxepin  as needed to help maintain sleep. We will monitor how well doxepin  works for you and may consider adding another medication if needed  INSTRUCTIONS: Please continue using your CPAP machine at the current pressure setting of 5 cm H2O. Start taking doxepin  as needed for sleep maintenance and monitor its effectiveness. If doxepin  does not help, we may consider adding Atarax. Follow up with us  to discuss how you are responding to the new medication.  Follow-up 6 months with Landry NP

## 2024-04-06 ENCOUNTER — Ambulatory Visit: Admitting: Family Medicine

## 2024-04-06 ENCOUNTER — Other Ambulatory Visit (HOSPITAL_BASED_OUTPATIENT_CLINIC_OR_DEPARTMENT_OTHER): Payer: Self-pay

## 2024-04-06 ENCOUNTER — Encounter: Payer: Self-pay | Admitting: Family Medicine

## 2024-04-06 ENCOUNTER — Other Ambulatory Visit: Payer: Self-pay

## 2024-04-06 VITALS — BP 126/62 | HR 61 | Temp 98.3°F | Ht 68.0 in | Wt 168.0 lb

## 2024-04-06 DIAGNOSIS — Z23 Encounter for immunization: Secondary | ICD-10-CM

## 2024-04-06 DIAGNOSIS — Z0000 Encounter for general adult medical examination without abnormal findings: Secondary | ICD-10-CM | POA: Diagnosis not present

## 2024-04-06 NOTE — Progress Notes (Signed)
 Established Patient Office Visit  Subjective   Patient ID: Neil Cox, male    DOB: July 21, 1966  Age: 58 y.o. MRN: [id number]  Chief Complaint  Patient presents with   Annual Exam    HPI    [name] is seen today for physical exam.  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  He is on testosterone  replacement.  Generally doing well.  His oldest daughter just started college at [employer].  She will be swimming there.  [name] recently saw pulmonologist.  Still has some insomnia issues and was placed on doxepin  6 mg and has not yet started.  He had labs prior to visit today.  Was significant for glucose 109.  Lipids stable.  PSA normal.  Testosterone  level over thousand but he had just had injection 2 days prior  Health maintenance reviewed:  -Due for repeat colonoscopy now-and he plans to set this up -No history of pneumonia vaccine or Shingrix and he will consider -He plans to get flu vaccine later this fall -Due for tetanus now which he agrees to get today  Family history-mother alive and well.  Father and brother have type 2 diabetes.  No history of cancer.  No family history of premature CAD.  Social history-married with 2 daughters.  One is in high school and the other 1 was starting at [employer] this fall.  Salvadore used oral tobacco up until early 2000's.  Never smoked.  No regular alcohol.  Works as a Adult nurse.  Past Medical History:  Diagnosis Date   Allergy    Arthritis    Cancer (HCC) 2001   right thumb cancer/squamous cell   DDD (degenerative disc disease), lumbosacral    L3-L4   GERD (gastroesophageal reflux disease)    Hypertension    in past   Past Surgical History:  Procedure Laterality Date   EXCISION MORTON'S NEUROMA     30 years ago   thumb surg     had cancer right thumb   UPPER GASTROINTESTINAL ENDOSCOPY     occasional GERD/no meds    reports that he has never  smoked. He quit smokeless tobacco use about 24 years ago.  His smokeless tobacco use included chew. He reports that he does not drink alcohol and does not use drugs. family history includes Arthritis in his father and paternal grandmother; Diabetes in his brother and father; Hypertension in his paternal grandmother; Osteoarthritis in his mother. Not on File  Review of Systems  Constitutional:  Negative for chills, fever, malaise/fatigue and weight loss.  HENT:  Negative for hearing loss.   Eyes:  Negative for blurred vision.  Respiratory:  Negative for cough and shortness of breath.   Cardiovascular:  Negative for chest pain, palpitations and leg swelling.  Gastrointestinal:  Negative for abdominal pain, blood in stool, constipation and diarrhea.  Genitourinary:  Negative for dysuria.  Skin:  Negative for rash.  Neurological:  Negative for dizziness, speech change, seizures, loss of consciousness and headaches.  Psychiatric/Behavioral:  Negative for depression. The patient has insomnia.       Objective:     BP 126/62   Pulse 61   Temp 98.3 F (36.8 C) (Oral)   Ht 5' 8 (1.727 m)   Wt 168 lb (76.2 kg)   SpO2 97%   BMI 25.54 kg/m  BP Readings from Last 3 Encounters:  04/06/24 126/62  04/05/24 115/68  01/23/23 (!) 149/78  Wt Readings from Last 3 Encounters:  04/06/24 168 lb (76.2 kg)  04/05/24 168 lb (76.2 kg)  03/09/23 164 lb (74.4 kg)      Physical Exam Vitals reviewed.  Constitutional:      General: He is not in acute distress.    Appearance: He is well-developed. He is not ill-appearing.  HENT:     Right Ear: External ear normal.     Left Ear: External ear normal.  Eyes:     Pupils: Pupils are equal, round, and reactive to light.  Neck:     Thyroid : No thyromegaly.  Cardiovascular:     Rate and Rhythm: Normal rate and regular rhythm.  Pulmonary:     Effort: Pulmonary effort is normal. No respiratory distress.     Breath sounds: Normal breath sounds. No  wheezing or rales.  Abdominal:     Palpations: Abdomen is soft. There is no mass.     Tenderness: There is no abdominal tenderness. There is no guarding or rebound.  Musculoskeletal:     Cervical back: Neck supple.     Right lower leg: No edema.     Left lower leg: No edema.  Neurological:     General: No focal deficit present.     Mental Status: He is alert and oriented to person, place, and time.      No results found for any visits on 04/06/24.  Last CBC Lab Results  Component Value Date   WBC 4.8 03/31/2024   HGB 15.9 03/31/2024   HCT 47.4 03/31/2024   MCV 91.0 03/31/2024   RDW 13.6 03/31/2024   PLT 190.0 03/31/2024   Last metabolic panel Lab Results  Component Value Date   GLUCOSE 109 (H) 03/31/2024   NA 139 03/31/2024   K 4.2 03/31/2024   CL 100 03/31/2024   CO2 30 03/31/2024   BUN 19 03/31/2024   CREATININE 0.99 03/31/2024   GFR 84.74 03/31/2024   CALCIUM 9.3 03/31/2024   PROT 7.1 03/31/2024   ALBUMIN 4.8 03/31/2024   BILITOT 1.1 03/31/2024   ALKPHOS 36 (L) 03/31/2024   AST 19 03/31/2024   ALT 19 03/31/2024   Last lipids Lab Results  Component Value Date   CHOL 176 03/31/2024   HDL 57.60 03/31/2024   LDLCALC 108 (H) 03/31/2024   TRIG 49.0 03/31/2024   CHOLHDL 3 03/31/2024   Last thyroid  functions Lab Results  Component Value Date   TSH 0.73 03/31/2024      The 10-year ASCVD risk score (Arnett DK, et al., 2019) is: 5.1%    Assessment & Plan:   Here for physical exam.  History of skin cancer as above, low testosterone , obstructive sleep apnea.  Has some chronic insomnia and recently placed on low-dose doxepin  per pulmonary.  We discussed the following health maintenance issues today  -Set up repeat colonoscopy - Tdap given Discussed vaccines and recommend he consider Prevnar 20 and Shingrix at some point this year - Plans to get flu vaccine later this fall - Continue annual dermatology follow-up with his history of skin cancer - Need to  monitor PSA at least yearly and CBC every 6 to 12 months - Do recommend A1c baseline with next labs-especially with his strong family history of type 2 diabetes  Wolm Scarlet, MD

## 2024-04-06 NOTE — Patient Instructions (Signed)
 Set up repeat colonoscopy  Consider Shingrix and Prevnar 20 (pneumonia) vaccines.

## 2024-04-08 ENCOUNTER — Other Ambulatory Visit (HOSPITAL_BASED_OUTPATIENT_CLINIC_OR_DEPARTMENT_OTHER): Payer: Self-pay

## 2024-04-08 ENCOUNTER — Telehealth: Payer: Self-pay | Admitting: Pharmacy Technician

## 2024-04-08 NOTE — Telephone Encounter (Signed)
 Pharmacy Patient Advocate Encounter   Received notification from Patient Pharmacy that prior authorization for Doxepin  is required/requested.   Insurance verification completed.   The patient is insured through [facility] .   Initiated request and insurance rejected- This drug/product is not covered under the pharmacy benefit. Prior Authorization is not available.

## 2024-04-18 ENCOUNTER — Other Ambulatory Visit: Payer: Self-pay

## 2024-04-18 ENCOUNTER — Other Ambulatory Visit: Payer: Self-pay | Admitting: Family Medicine

## 2024-04-18 ENCOUNTER — Other Ambulatory Visit (HOSPITAL_BASED_OUTPATIENT_CLINIC_OR_DEPARTMENT_OTHER): Payer: Self-pay

## 2024-04-18 MED ORDER — "BD LUER-LOK SYRINGE 18G X 1-1/2"" 3 ML MISC"
0 refills | Status: DC
  Filled 2024-04-18: qty 10, 10d supply, fill #0

## 2024-04-18 MED ORDER — "BD DISP NEEDLE 23G X 1"" MISC"
0 refills | Status: DC
  Filled 2024-04-18: qty 10, 10d supply, fill #0

## 2024-04-20 NOTE — Telephone Encounter (Signed)
 I would try 10mg  melatonin over the counter with the doxepin 

## 2024-04-20 NOTE — Telephone Encounter (Signed)
**Note De-identified  [place] Obfuscation** Please advise 

## 2024-06-14 ENCOUNTER — Other Ambulatory Visit: Payer: Self-pay | Admitting: Family Medicine

## 2024-06-14 ENCOUNTER — Other Ambulatory Visit (HOSPITAL_BASED_OUTPATIENT_CLINIC_OR_DEPARTMENT_OTHER): Payer: Self-pay

## 2024-06-14 MED ORDER — BD LUER-LOK SYRINGE 18G X 1-1/2" 3 ML MISC
0 refills | Status: DC
  Filled 2024-06-14: qty 10, 10d supply, fill #0

## 2024-06-14 MED ORDER — BD DISP NEEDLE 23G X 1" MISC
0 refills | Status: DC
  Filled 2024-06-14: qty 10, 10d supply, fill #0

## 2024-06-15 ENCOUNTER — Other Ambulatory Visit: Payer: Self-pay

## 2024-06-15 ENCOUNTER — Other Ambulatory Visit (HOSPITAL_BASED_OUTPATIENT_CLINIC_OR_DEPARTMENT_OTHER): Payer: Self-pay

## 2024-06-16 ENCOUNTER — Other Ambulatory Visit (HOSPITAL_BASED_OUTPATIENT_CLINIC_OR_DEPARTMENT_OTHER): Payer: Self-pay

## 2024-06-16 ENCOUNTER — Other Ambulatory Visit: Payer: Self-pay

## 2024-06-17 ENCOUNTER — Other Ambulatory Visit (HOSPITAL_BASED_OUTPATIENT_CLINIC_OR_DEPARTMENT_OTHER): Payer: Self-pay

## 2024-06-27 ENCOUNTER — Other Ambulatory Visit (HOSPITAL_BASED_OUTPATIENT_CLINIC_OR_DEPARTMENT_OTHER): Payer: Self-pay

## 2024-06-27 DIAGNOSIS — L570 Actinic keratosis: Secondary | ICD-10-CM | POA: Diagnosis not present

## 2024-06-27 MED ORDER — FLUOROURACIL 5 % EX CREA
TOPICAL_CREAM | CUTANEOUS | 0 refills | Status: AC
  Filled 2024-06-27: qty 40, 30d supply, fill #0

## 2024-07-20 ENCOUNTER — Other Ambulatory Visit: Payer: Self-pay | Admitting: Family Medicine

## 2024-07-21 ENCOUNTER — Other Ambulatory Visit (HOSPITAL_BASED_OUTPATIENT_CLINIC_OR_DEPARTMENT_OTHER): Payer: Self-pay

## 2024-07-21 ENCOUNTER — Other Ambulatory Visit: Payer: Self-pay

## 2024-07-21 MED ORDER — TESTOSTERONE CYPIONATE 200 MG/ML IM SOLN
200.0000 mg | INTRAMUSCULAR | 1 refills | Status: AC
  Filled 2024-07-21: qty 2, 28d supply, fill #0
  Filled 2024-08-23: qty 2, 28d supply, fill #1
  Filled 2024-09-16: qty 2, 28d supply, fill #2

## 2024-07-21 MED ORDER — "BD LUER-LOK SYRINGE 18G X 1-1/2"" 3 ML MISC"
0 refills | Status: DC
  Filled 2024-07-21: qty 10, 140d supply, fill #0

## 2024-07-21 MED ORDER — "BD DISP NEEDLE 23G X 1"" MISC"
0 refills | Status: DC
  Filled 2024-07-21: qty 10, 140d supply, fill #0

## 2024-07-22 ENCOUNTER — Other Ambulatory Visit: Payer: Self-pay

## 2024-08-23 ENCOUNTER — Other Ambulatory Visit: Payer: Self-pay

## 2024-08-23 ENCOUNTER — Other Ambulatory Visit: Payer: Self-pay | Admitting: Family Medicine

## 2024-08-24 ENCOUNTER — Other Ambulatory Visit: Payer: Self-pay

## 2024-08-24 ENCOUNTER — Other Ambulatory Visit (HOSPITAL_BASED_OUTPATIENT_CLINIC_OR_DEPARTMENT_OTHER): Payer: Self-pay

## 2024-08-24 MED ORDER — "BD DISP NEEDLE 23G X 1"" MISC"
0 refills | Status: AC
  Filled 2024-08-24: qty 10, fill #0

## 2024-08-24 MED ORDER — "BD LUER-LOK SYRINGE 18G X 1-1/2"" 3 ML MISC"
0 refills | Status: AC
  Filled 2024-08-24: qty 10, 10d supply, fill #0

## 2024-09-16 ENCOUNTER — Other Ambulatory Visit (HOSPITAL_BASED_OUTPATIENT_CLINIC_OR_DEPARTMENT_OTHER): Payer: Self-pay
